# Patient Record
Sex: Male | Born: 1975 | Race: White | Hispanic: No | Marital: Married | Smoking: Former smoker
Health system: Southern US, Community
[De-identification: ages and names within clinical notes are randomized; demographics above are authoritative.]

## PROBLEM LIST (undated history)

## (undated) DIAGNOSIS — N200 Calculus of kidney: Secondary | ICD-10-CM

## (undated) DIAGNOSIS — K56609 Unspecified intestinal obstruction, unspecified as to partial versus complete obstruction: Secondary | ICD-10-CM

## (undated) DIAGNOSIS — I10 Essential (primary) hypertension: Secondary | ICD-10-CM

## (undated) DIAGNOSIS — G4730 Sleep apnea, unspecified: Secondary | ICD-10-CM

## (undated) DIAGNOSIS — E785 Hyperlipidemia, unspecified: Secondary | ICD-10-CM

## (undated) HISTORY — PX: WRIST SURGERY: SHX841

## (undated) HISTORY — DX: Hyperlipidemia, unspecified: E78.5

## (undated) HISTORY — PX: OTHER SURGICAL HISTORY: SHX169

---
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
## 1998-02-22 ENCOUNTER — Emergency Department (HOSPITAL_COMMUNITY): Admission: EM | Admit: 1998-02-22 | Discharge: 1998-02-22 | Payer: Self-pay | Admitting: Emergency Medicine

## 1998-02-22 ENCOUNTER — Encounter: Payer: Self-pay | Admitting: Emergency Medicine

## 2001-08-23 ENCOUNTER — Ambulatory Visit (HOSPITAL_BASED_OUTPATIENT_CLINIC_OR_DEPARTMENT_OTHER): Admission: RE | Admit: 2001-08-23 | Discharge: 2001-08-23 | Payer: Self-pay | Admitting: Emergency Medicine

## 2003-05-29 ENCOUNTER — Ambulatory Visit (HOSPITAL_COMMUNITY): Admission: RE | Admit: 2003-05-29 | Discharge: 2003-05-29 | Payer: Self-pay | Admitting: Urology

## 2003-05-29 ENCOUNTER — Encounter (INDEPENDENT_AMBULATORY_CARE_PROVIDER_SITE_OTHER): Payer: Self-pay | Admitting: Specialist

## 2003-05-29 ENCOUNTER — Ambulatory Visit (HOSPITAL_BASED_OUTPATIENT_CLINIC_OR_DEPARTMENT_OTHER): Admission: RE | Admit: 2003-05-29 | Discharge: 2003-05-29 | Payer: Self-pay | Admitting: Urology

## 2003-07-24 ENCOUNTER — Emergency Department (HOSPITAL_COMMUNITY): Admission: EM | Admit: 2003-07-24 | Discharge: 2003-07-24 | Payer: Self-pay | Admitting: Emergency Medicine

## 2005-04-12 ENCOUNTER — Emergency Department (HOSPITAL_COMMUNITY): Admission: EM | Admit: 2005-04-12 | Discharge: 2005-04-12 | Payer: Self-pay | Admitting: Emergency Medicine

## 2006-06-26 ENCOUNTER — Ambulatory Visit (HOSPITAL_BASED_OUTPATIENT_CLINIC_OR_DEPARTMENT_OTHER): Admission: RE | Admit: 2006-06-26 | Discharge: 2006-06-26 | Payer: Self-pay | Admitting: Emergency Medicine

## 2006-07-01 ENCOUNTER — Ambulatory Visit: Payer: Self-pay | Admitting: Internal Medicine

## 2006-08-15 ENCOUNTER — Ambulatory Visit: Payer: Self-pay | Admitting: Pulmonary Disease

## 2006-11-06 ENCOUNTER — Ambulatory Visit: Payer: Self-pay | Admitting: Pulmonary Disease

## 2006-12-05 ENCOUNTER — Emergency Department (HOSPITAL_COMMUNITY): Admission: EM | Admit: 2006-12-05 | Discharge: 2006-12-05 | Payer: Self-pay | Admitting: Emergency Medicine

## 2007-09-05 ENCOUNTER — Emergency Department (HOSPITAL_COMMUNITY): Admission: EM | Admit: 2007-09-05 | Discharge: 2007-09-05 | Payer: Self-pay | Admitting: Emergency Medicine

## 2007-10-08 ENCOUNTER — Ambulatory Visit: Payer: Self-pay | Admitting: Urology

## 2007-10-15 ENCOUNTER — Emergency Department (HOSPITAL_COMMUNITY): Admission: EM | Admit: 2007-10-15 | Discharge: 2007-10-16 | Payer: Self-pay | Admitting: Emergency Medicine

## 2007-10-23 ENCOUNTER — Ambulatory Visit: Payer: Self-pay | Admitting: Urology

## 2007-10-24 ENCOUNTER — Ambulatory Visit: Payer: Self-pay | Admitting: Urology

## 2007-11-01 ENCOUNTER — Emergency Department (HOSPITAL_COMMUNITY): Admission: EM | Admit: 2007-11-01 | Discharge: 2007-11-02 | Payer: Self-pay | Admitting: Emergency Medicine

## 2007-11-02 ENCOUNTER — Emergency Department (HOSPITAL_COMMUNITY): Admission: EM | Admit: 2007-11-02 | Discharge: 2007-11-02 | Payer: Self-pay | Admitting: Emergency Medicine

## 2007-11-12 ENCOUNTER — Ambulatory Visit: Payer: Self-pay | Admitting: Urology

## 2007-11-25 ENCOUNTER — Encounter: Admission: RE | Admit: 2007-11-25 | Discharge: 2007-11-25 | Payer: Self-pay | Admitting: Emergency Medicine

## 2008-10-30 ENCOUNTER — Encounter: Admission: RE | Admit: 2008-10-30 | Discharge: 2008-10-30 | Payer: Self-pay | Admitting: Internal Medicine

## 2009-01-04 ENCOUNTER — Ambulatory Visit (HOSPITAL_BASED_OUTPATIENT_CLINIC_OR_DEPARTMENT_OTHER): Admission: RE | Admit: 2009-01-04 | Discharge: 2009-01-04 | Payer: Self-pay | Admitting: Urology

## 2009-01-23 IMAGING — CR DG ABDOMEN 1V
1 series · 2 of 2 positions shown · non-contrast
Comparison: none

REASON FOR EXAM: Nephrolithiasis
COMMENTS:

[Series 1: view not recorded · 0.17mm/px · 2 of 2 slices shown]
[im 1/2]
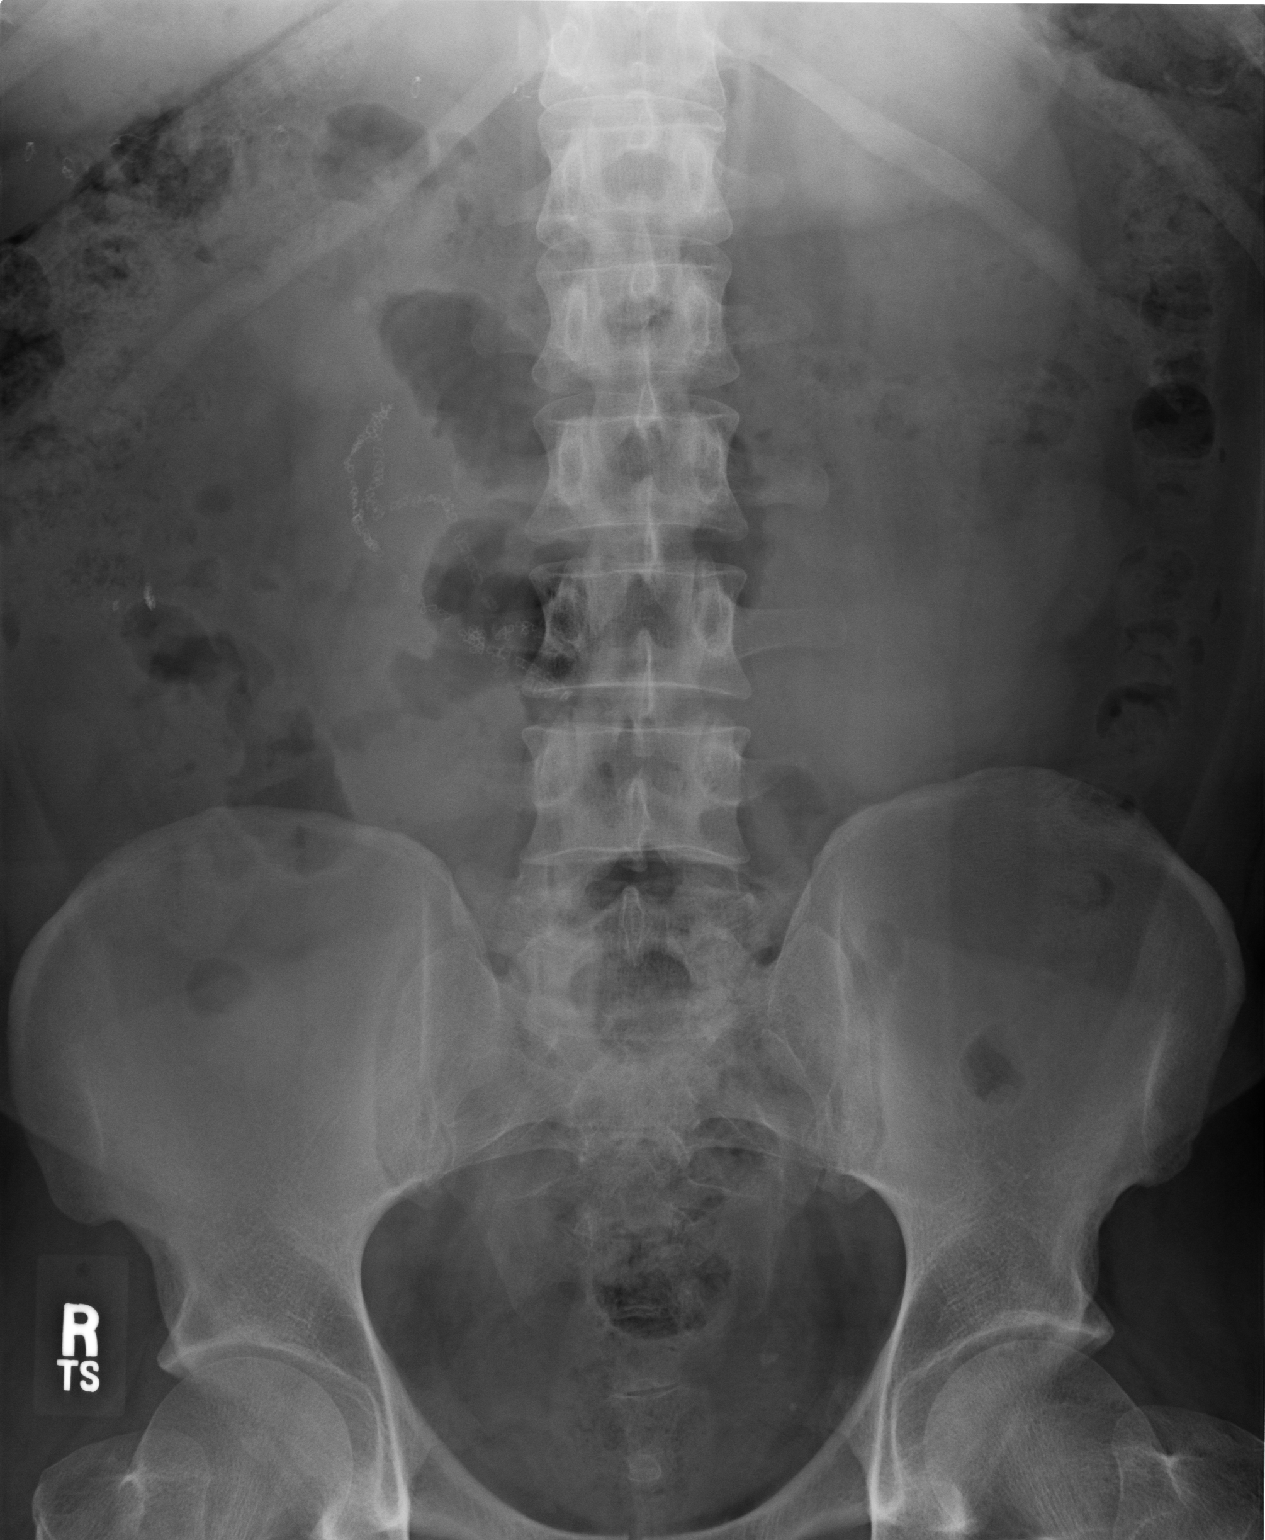
[im 2/2]
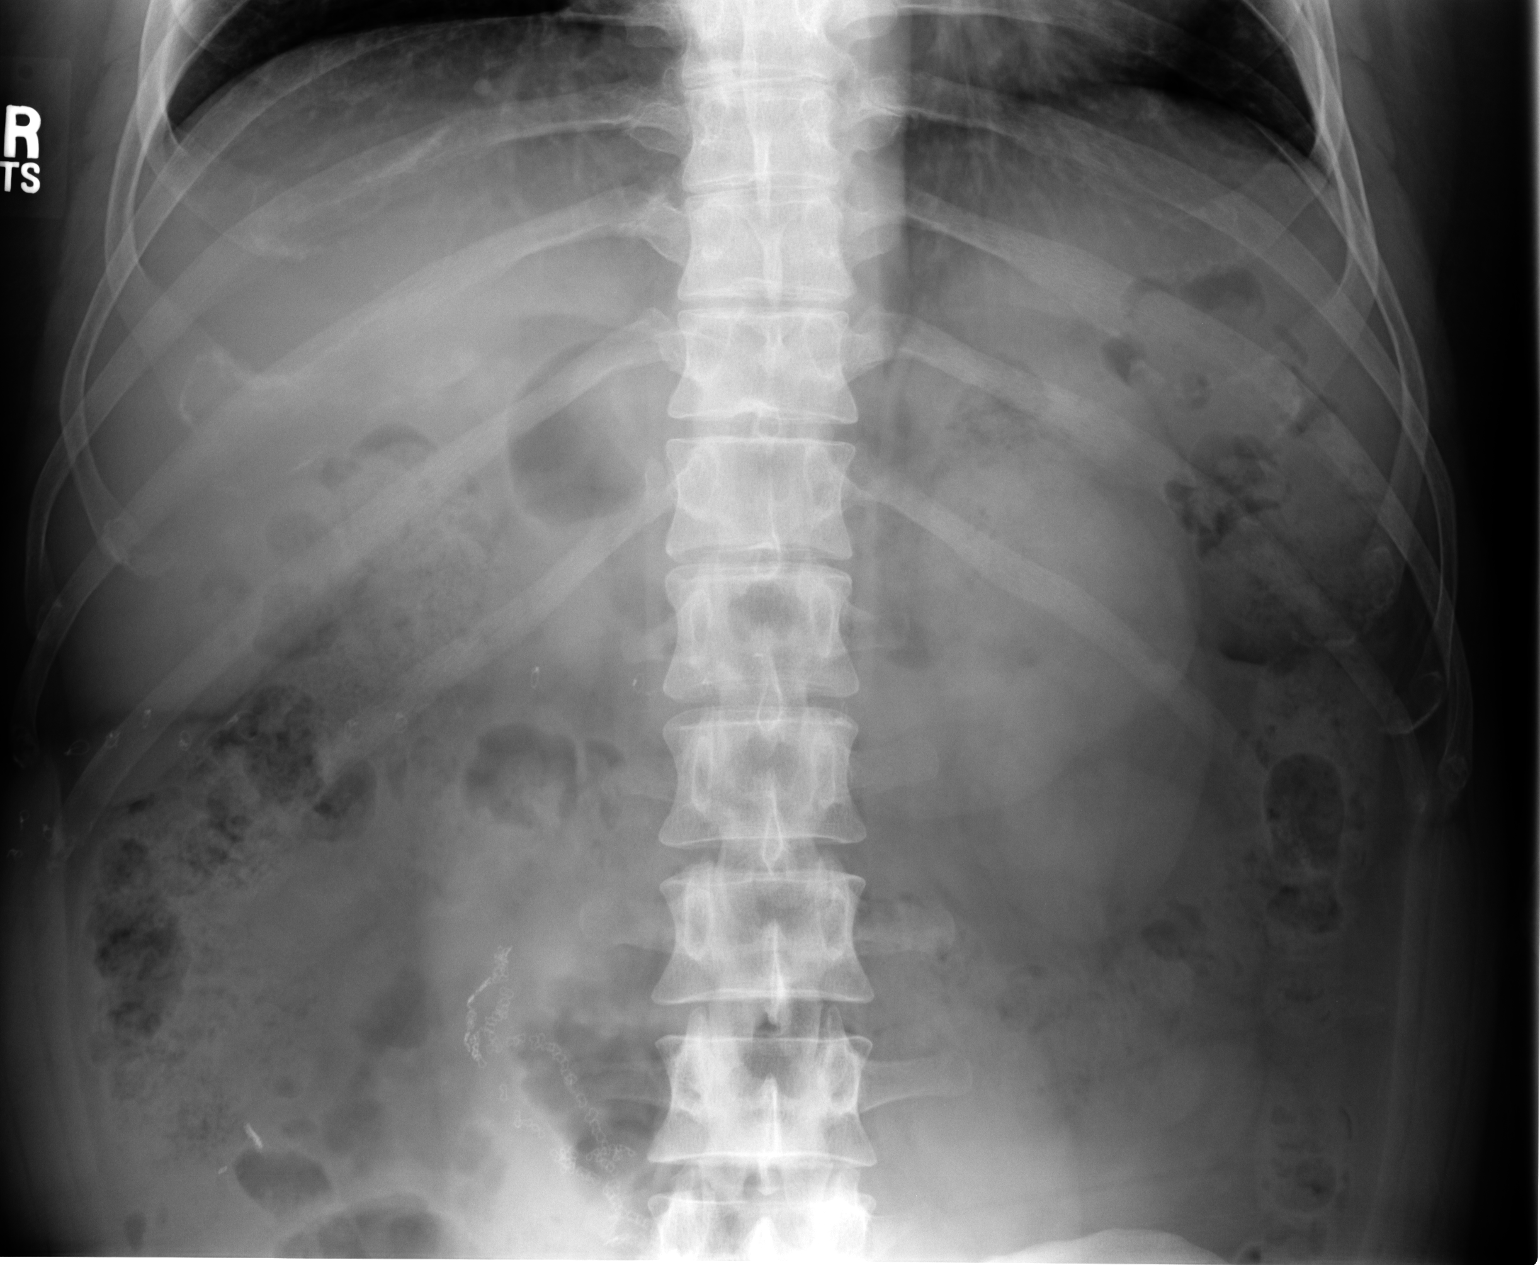

[2 of 2 positions shown; findings below may reference images not displayed]

PROCEDURE:     DXR - DXR KIDNEY URETER BLADDER  - October 08, 2007 [DATE]

RESULT:     There are no prior examinations available for comparison.

The current exam shows a triangular 5.5 mm density in the LEFT pelvis
projected near the level of the LEFT ureterovesical junction suspicious for
a distal LEFT ureteral stone. In one of the two views obtained, there is a
4.0 mm density projected over the lower pole of the RIGHT kidney. This could
possibly represent a lower pole RIGHT renal stone but since the finding is
not seen on the second view, it more likely is artifact. No other densities
suspicious for renal or ureteral stones are identified. Post operative
metallic clips are noted in the RIGHT abdomen.
IMPRESSION: Please see above.

## 2009-02-16 IMAGING — CT CT PELVIS W/O CM
1 series · 15 of 32 positions shown, 19 images · non-contrast
Comparison: 09/05/2007

CT ABDOMEN

CLINICAL DATA: Right flank pain

CT ABDOMEN AND PELVIS WITHOUT CONTRAST
TECHNIQUE: Multidetector CT imaging of the abdomen and pelvis was
performed followig the standard protocol without intravenous
contrast.

[Series 2: 220 stone 5.0 b40f st · axial · 0.74mm/px · z∈[-516,-132]mm · 15 of 86 slices shown, 19 images]
[im 6/86  soft-tissue]
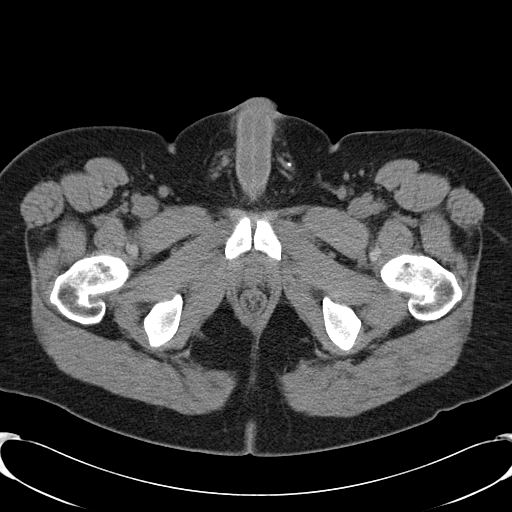
[im 6/86  bone]
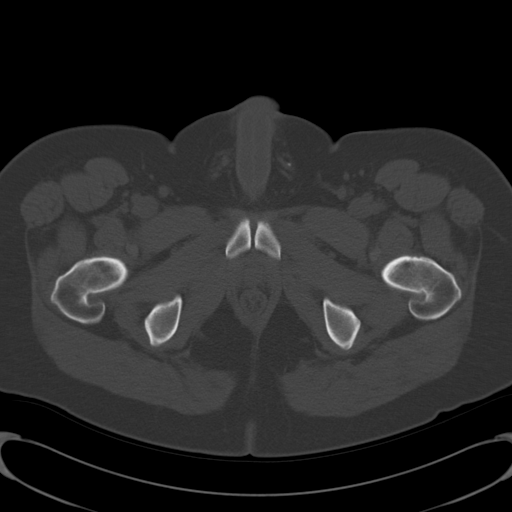
[im 11/86  soft-tissue]
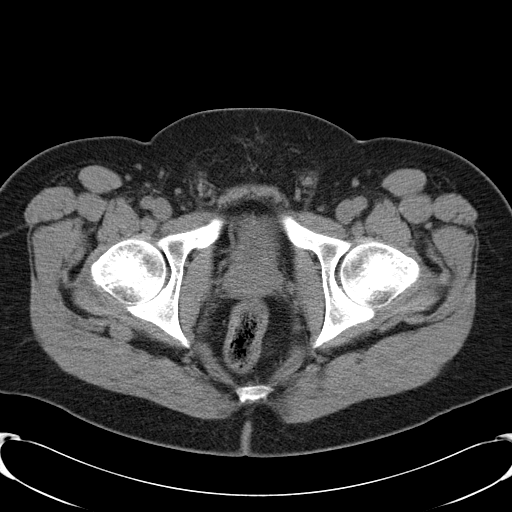
[im 17/86  soft-tissue]
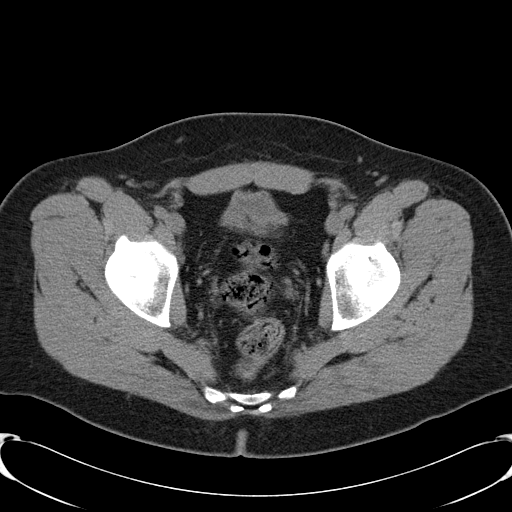
[im 25/86  soft-tissue]
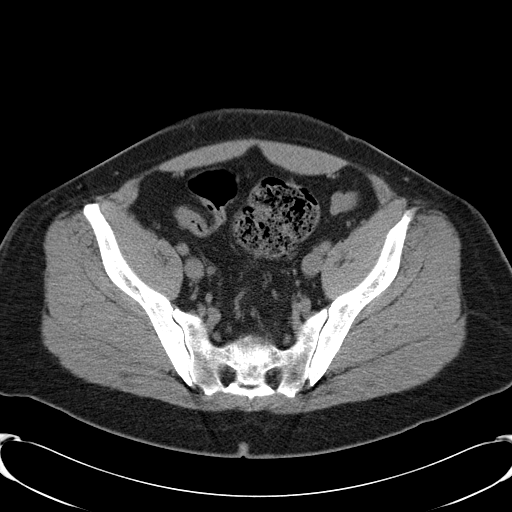
[im 31/86  soft-tissue]
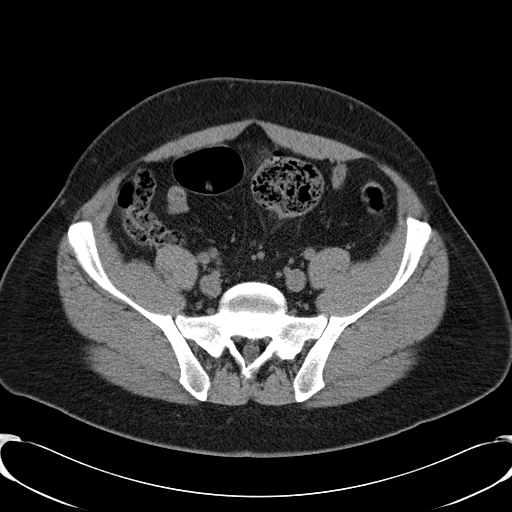
[im 36/86  soft-tissue]
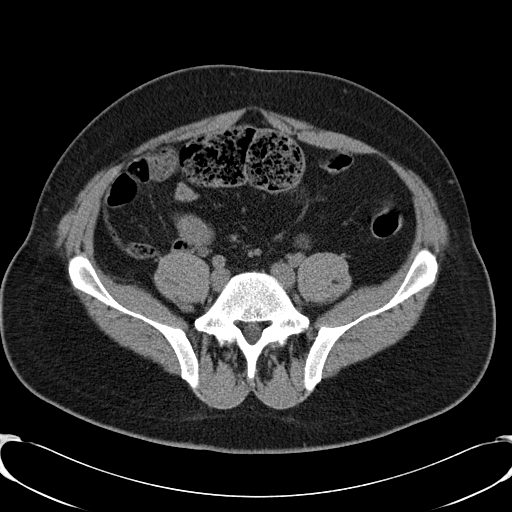
[im 44/86  soft-tissue]
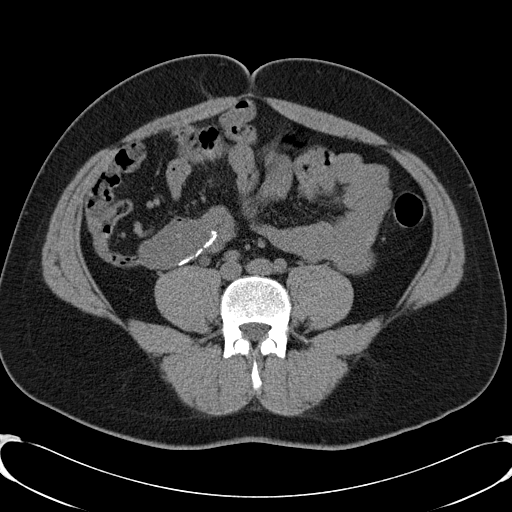
[im 50/86  soft-tissue]
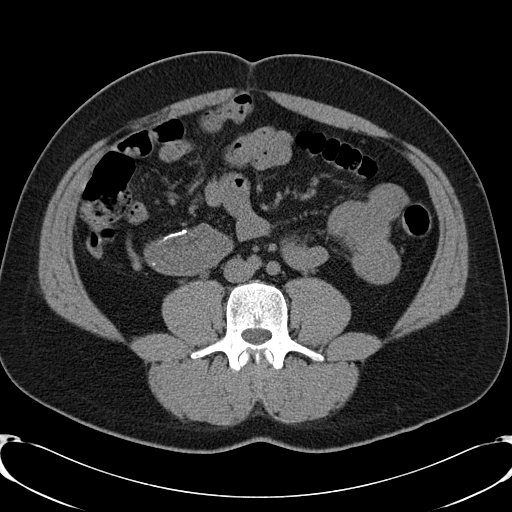
[im 55/86  soft-tissue]
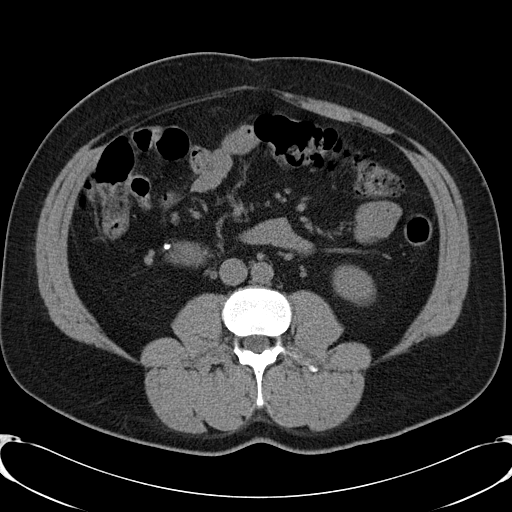
[im 55/86  bone]
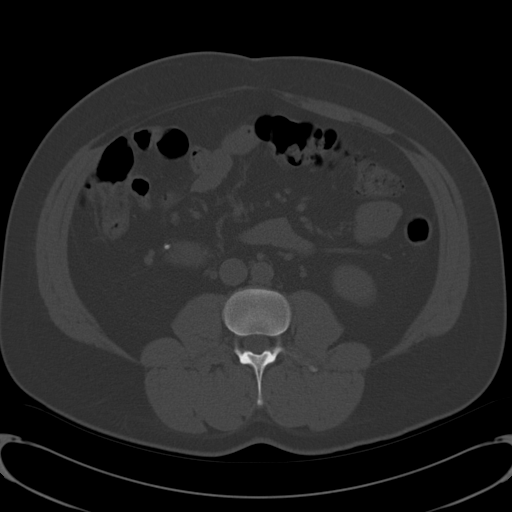
[im 61/86  soft-tissue]
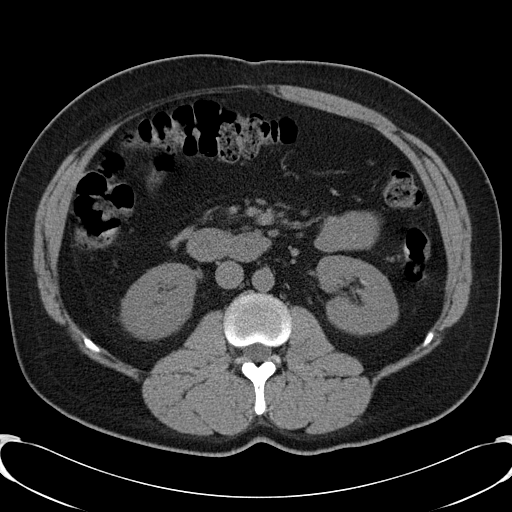
[im 69/86  soft-tissue]
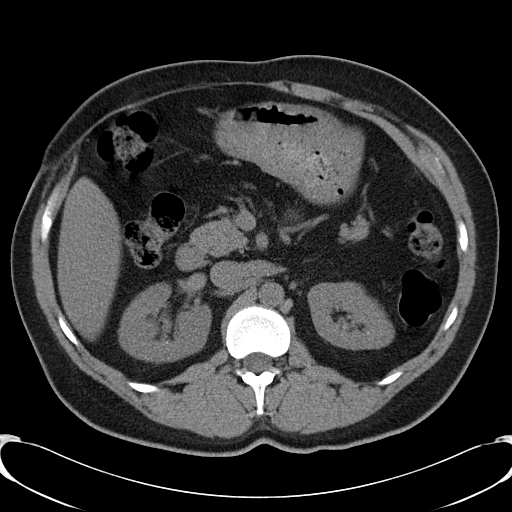
[im 75/86  soft-tissue]
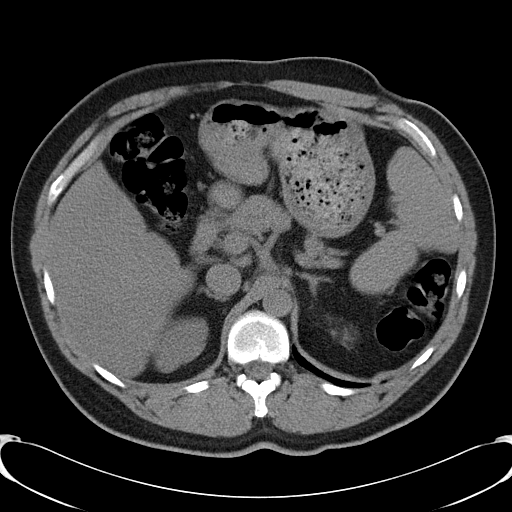
[im 75/86  lung]
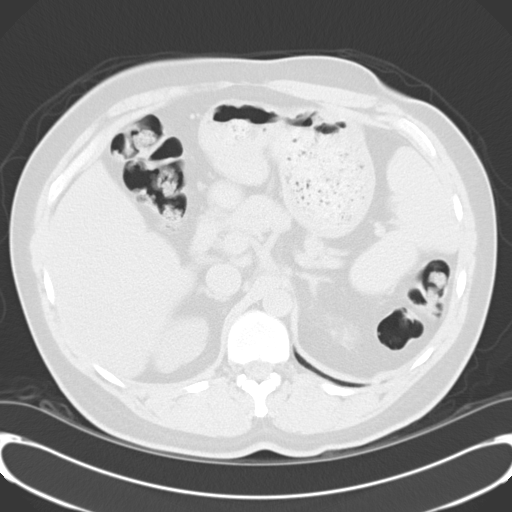
[im 77/86  lung]
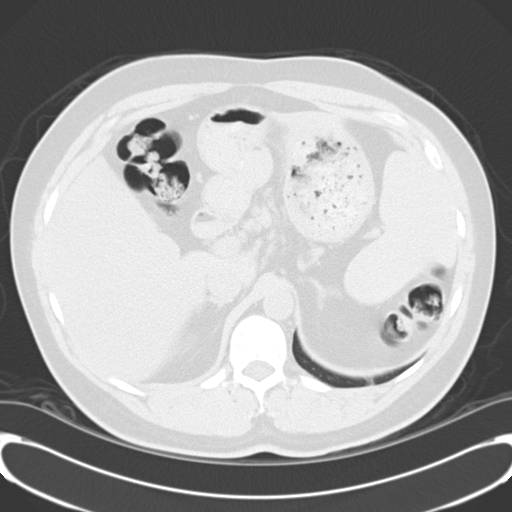
[im 80/86  soft-tissue]
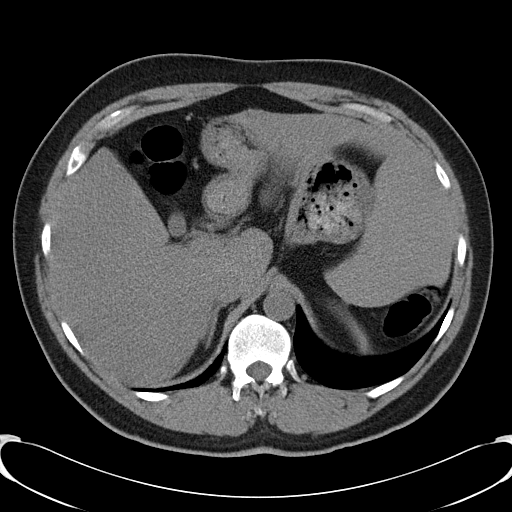
[im 80/86  lung]
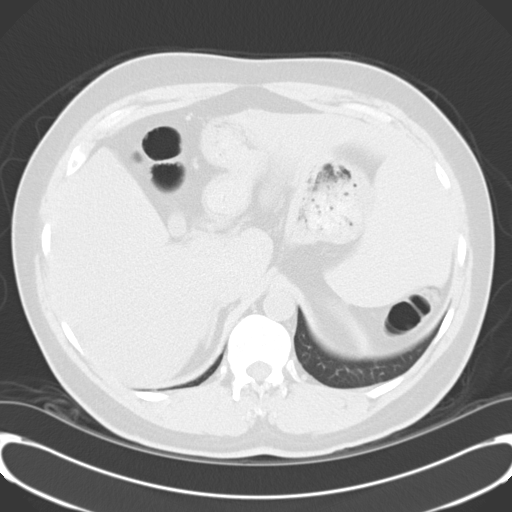
[im 83/86  lung]
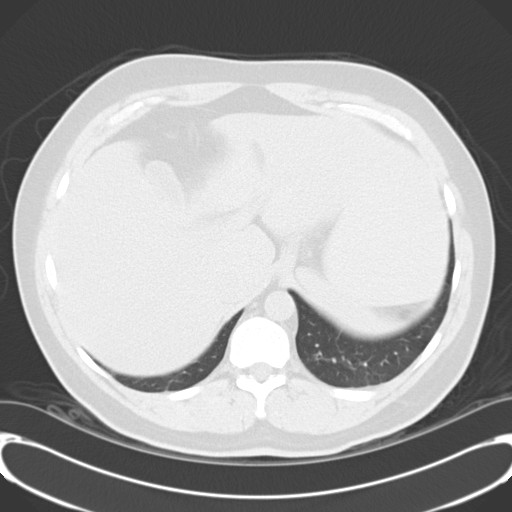

[15 of 32 positions shown; findings below may reference images not displayed]

FINDINGS: The lung bases are clear.  The unenhanced appearance of
the liver and spleen are unremarkable.  The pancreas, adrenal
glands and left kidney are unremarkable except for two tiny left
renal calculi.  There is mild hydronephrosis of the right kidney
and several small renal calculi.  No perinephric interstitial
changes or fluid.

There are surgical changes involve the abdominal wall and the
bowel.  No abdominal mass lesions or adenopathy.  The aorta is
normal in caliber.
IMPRESSION: 1.  Mild to moderate obstructive findings involving the right
kidney.
2.  Bilateral renal calculi.

CT PELVIS
FINDINGS: There is a 5 mm right distal ureteral calculus causing
the mild to moderate right-sided hydroureteronephrosis.  This is
located at the level of the acetabulum.  The rectum, sigmoid colon
visualized small bowel loops unremarkable.  No pelvic masses or
adenopathy.  No significant bony findings.  A small urachal remnant
is noted.
IMPRESSION: 1.  5 mm distal right ureteral calculus causing mild to moderate
grade obstruction.

## 2010-04-26 LAB — POCT I-STAT 4, (NA,K, GLUC, HGB,HCT)
Glucose, Bld: 109 mg/dL — ABNORMAL HIGH (ref 70–99)
HCT: 45 % (ref 39.0–52.0)
Hemoglobin: 15.3 g/dL (ref 13.0–17.0)
Potassium: 4.2 mEq/L (ref 3.5–5.1)
Sodium: 139 mEq/L (ref 135–145)

## 2010-04-26 LAB — GLUCOSE, CAPILLARY: Glucose-Capillary: 105 mg/dL — ABNORMAL HIGH (ref 70–99)

## 2010-06-07 NOTE — Procedures (Signed)
NAME:  Patrick Scott, WIEN NO.:  [id number]   MEDICAL RECORD NO.:  [id number]          PATIENT TYPE:  OUT   LOCATION:  SLEEP CENTER                 FACILITY:  [facility]   PHYSICIAN:  Clinton D. Maple Hudson, MD, FCCP, FACPDATE OF BIRTH:  06-23-1974   DATE OF STUDY:  06/26/2006                            NOCTURNAL POLYSOMNOGRAM   REFERRING PHYSICIAN:   INDICATION FOR STUDY:  Hypersomnia with sleep apnea.   EPWORTH SLEEPINESS SCORE:  8/24, BMI 29.3, weight 205 pounds.   HOME MEDICATIONS:  Listed and reviewed.   SLEEP ARCHITECTURE:  Total sleep time 328 minutes with sleep efficiency  89%.  Stage 1 was 4%, stage 2 80%, stages 3 and 4 3%, REM 13% of total  sleep time.  Sleep latency 14 minutes, REM latency 228 minutes, awake  after sleep onset 28 minutes, arousal index 18.8.  No bedtime medication  was taken.   RESPIRATORY DATA:  Split study protocol.  Apnea/hypopnea index (AHI,  RDI) 28.6 obstructive events per hour indicating moderate obstructive  sleep apnea/hypopnea syndrome before CPAP.  There were 33 obstructive  apneas and 30 hypopneas before CPAP.  The events were more common while  supine, but also significant in lateral sleep positions.  REM AHI 1.4.  CPAP was titrated to 15 CWP, AHI 0 per hour.  A large Respironics  Comfort Gel full face mask was chosen with heated humidifier.   OXYGEN DATA:  Moderate to loud snoring with oxygen desaturation to a  nadir of 86%.  After CPAP control, saturation held 94 to 95% on room  air.   CARDIAC DATA:  Normal sinus rhythm with occasional PVC.   MOVEMENT/PARASOMNIA:  Occasional limb jerk with arousal, insignificant.  No bathroom trips.   IMPRESSION/RECOMMENDATION:  1. Moderate obstructive sleep apnea/hypopnea syndrome, (AHI)      apnea/hypopnea index 28.6 per hour.  Events somewhat more common      while supine.  Moderate to loud snoring with oxygen desaturation to      a nadir of 86%.  2. Successful (CPAP) continuous positive  airway pressure titration to      15 (CWP) centimeters of water pressure,      (AHI) apnea/hypopnea index 0 per hour.  A large Respironics Comfort      Gel full face mask was chosen with heated humidifier.      Clinton D. Maple Hudson, MD, [facility], FACP  Diplomate, Biomedical engineer of Sleep Medicine  Electronically Signed     CDY/MEDQ  D:  06/30/2006 13:00:28  T:  06/30/2006 21:08:15  Job:  [id number]

## 2010-06-07 NOTE — Assessment & Plan Note (Signed)
[facility]                             PULMONARY OFFICE NOTE   NAME:BERRIERAshkan, Patrick Scott                       MRN:          [id number]  DATE:08/15/2006                            DOB:          [birth year]    HISTORY OF PRESENT ILLNESS:  The patient is a 35 year old male who has  been referred for treatment of obstructive sleep apnea. The patient was  first diagnosed with sleep apnea in August 2003 where he was found to  have 13 events per hour. I discussed the various options with him  including CPAP, oral appliance, as well as consideration for upper  airway surgery. The patient decided on the latter and was referred to  Dr. Annalee Genta for evaluation. He agreed the surgery would be indicated  if the patient wanted to go in that direction. However, the patient did  not go through surgery at that time. He now feels like that his symptoms  are worse than they were in the past. He continues to have snoring and  pauses in his breathing during sleep. He typically goes to bed between 9  and 10, and he gets up at 5:30am to start his day. He is not rested upon  arising. The patient works as a IT consultant states that he  is way too busy to have any inappropriate daytime sleepiness. However,  in the evenings, the patient will fall asleep with TV or movies. He has  some sleep pressure with driving. Of note, his weight is up about 5 to  10 pounds over the past 2 years.   PAST MEDICAL HISTORY:  1. Significant for hypertension.  2. History of sleep apnea as stated above.  3. History of colon surgery as an infant.  4. Kidney stones.   CURRENT MEDICATIONS:  1. Altace 5 mg 1 daily.  2. Effexor XR 75 mg daily.   The patient is intolerant to [facility] which causes hallucinations.   SOCIAL HISTORY:  He is single and does not have children. He smokes a  1/2 pack of cigarettes per day and has done so for the last seven years.   FAMILY HISTORY:  Remarkable  for his father dying in 53 of lymphoma.   REVIEW OF SYSTEMS:  As per history of present illness. Also, see the  patient intake form documented on the chart.   PHYSICAL EXAMINATION:  GENERAL:  He is an overweight male in no acute  distress.  VITAL SIGNS:  Blood pressure 122/86, pulse 75, temperature 98.3, weight  213 pounds, O2 saturation on room air is 96%.  HEENT:  Pupils equal, round, and reactive to light and accommodation,  extraocular muscles are intact. Nares show deviated septum to the left.  Oropharynx does show large tonsils with elongation of the soft palette  and uvula.  NECK:  Supple without JVD or lymphadenopathy. There is no palpable  thyromegaly.  CHEST:  Totally clear.  CARDIAC:  Regular rate and rhythm with no murmurs, rubs, or gallops.  ABDOMEN:  Soft and nontender with good bowel sounds.  GENITAL:  Not done and not indicated.  RECTAL:  Not done and not indicated.  BREASTS:  Not done and not indicated.  LOWER EXTREMITIES:  Without edema, pulses are intact distally.  NEUROLOGIC:  Alert and oriented with no obvious motor deficits.   IMPRESSION:  Moderate obstructive sleep apnea from a recent sleep study  with a respiratory disturbance index of 28 events per hour and O2  saturation as low as 86%. The patient was then placed on CPAP and  titrated to a final pressure of 15 with excellent control. The patient  is willing to give this a try because of his increasing symptomatology  from the last visit.   PLAN:  1. Initiate CPAP at 10 sonometers and increase to 13 sonometers in two      weeks if the patient is doing well with it.  2. Work on weight loss.  3. He will follow up in four weeks where we will ultimately get him to      his final pressure of 15 centimeters of water pressure.     Barbaraann Share, MD,FCCP  Electronically Signed    [facility]/MedQ  DD: 09/12/2006  DT: 09/13/2006  Job #: [id number]   cc:   Patrick Scott, M.D.

## 2010-06-10 NOTE — Op Note (Signed)
NAME:  Patrick Scott, Patrick Scott NO.:  [id number]   MEDICAL RECORD NO.:  [id number]                   PATIENT TYPE:  AMB   LOCATION:  [facility]                                 FACILITY:  [facility]   PHYSICIAN:  Boston Service, M.D.             DATE OF BIRTH:  03-Jun-1975   DATE OF PROCEDURE:  05/29/2003  DATE OF DISCHARGE:                                 OPERATIVE REPORT   PREOPERATIVE DIAGNOSES:  A 35 year old male originally seen in our office  2003 medullary sponge kidney. CT scan Jun 05, 2001 scattered 1 and 2 mm  calculi right and left kidneys without evidence of ureteral obstruction.  The patient is status post phallus repair as a newborn, laparotomy for  adhesions as an adult, scattered GI staples and stitches throughout the  abdomen, 12.4 cm right kidney, 13.0 cm left kidney.  Recently patient noted  to have unusual amount of redundant foreskin along the ventral aspect of the  penile shaft.  I had a lengthy discussion with the patient about the various  options for treatment and followup.  The patient was scheduled for redo  circumcision on two separate occasions, he has rescheduled the procedure  twice.  He presents to [facility] today May 29, 2003 for redo  circumcision.   POSTOPERATIVE DIAGNOSES:  Same.   PROCEDURE:  Redo circumcision.   ANESTHESIA:  General.   DRAINS:  None.   COMPLICATIONS:  None.   DESCRIPTION OF PROCEDURE:  The patient was prepped and draped in the supine  position after institution of an adequate level of general anesthesia.  Penile block 0.25% lidocaine without epinephrine was instituted around the  penile base.  A circumferential incision was made proximal to the subcarinal  sulcus taking care to remove the unusual amount of redundant foreskin long  the ventral aspect of the penile shaft. A similar incision was made proximal  to the original incision and a ring of redundant preputial skin was removed  in a  parallel lines technique.  The subcutaneous tissue was then  reapproximated using interrupted sutures of 3-0 Vicryl, skin was then  reapproximated using interrupted sutures of 3-0 chromic.  The wound was  covered with bacitracin ointment, dry gauze and Coban tape.  The patient was  returned to recovery in satisfactory condition.                                               Boston Service, M.D.    RH/MEDQ  D:  05/29/2003  T:  05/29/2003  Job:  [id number]   cc:   [facility] [address]

## 2010-10-21 LAB — URINE MICROSCOPIC-ADD ON

## 2010-10-21 LAB — URINALYSIS, ROUTINE W REFLEX MICROSCOPIC
Protein, ur: NEGATIVE
pH: 5.5

## 2010-10-24 LAB — URINALYSIS, ROUTINE W REFLEX MICROSCOPIC
Glucose, UA: 100 — AB
Hgb urine dipstick: NEGATIVE
Ketones, ur: NEGATIVE
Leukocytes, UA: NEGATIVE
Leukocytes, UA: NEGATIVE
Nitrite: NEGATIVE
Protein, ur: NEGATIVE
Protein, ur: NEGATIVE
Specific Gravity, Urine: 1.02
Urobilinogen, UA: 0.2
Urobilinogen, UA: 0.2
Urobilinogen, UA: 0.2
pH: 6
pH: 5.5
pH: 6

## 2010-10-24 LAB — POCT I-STAT, CHEM 8
BUN: 20
Calcium, Ion: 1.18
Chloride: 103
Chloride: 104
Creatinine, Ser: 1.1
Creatinine, Ser: 1.3
Glucose, Bld: 144 — ABNORMAL HIGH
Glucose, Bld: 233 — ABNORMAL HIGH
HCT: 36 — ABNORMAL LOW
Hemoglobin: 13.6
Hemoglobin: 12.2 — ABNORMAL LOW
Potassium: 4.1
Sodium: 138
TCO2: 26
TCO2: 25

## 2010-10-24 LAB — COMPREHENSIVE METABOLIC PANEL
Albumin: 3.8
BUN: 18
Creatinine, Ser: 1.04
GFR calc non Af Amer: >60
Sodium: 133 — ABNORMAL LOW
Total Protein: 6.4

## 2010-10-24 LAB — DIFFERENTIAL
Eosinophils Absolute: 0.1
Eosinophils Relative: 1
Lymphocytes Relative: 43
Lymphs Abs: 3
Monocytes Absolute: 0.4
Neutrophils Relative %: 49

## 2010-10-24 LAB — URINE MICROSCOPIC-ADD ON

## 2010-10-24 LAB — CBC
MCHC: 34.2
Platelets: 173
RBC: 4.45

## 2010-11-01 LAB — BASIC METABOLIC PANEL
BUN: 12
CO2: 26
Glucose, Bld: 97

## 2010-11-01 LAB — D-DIMER, QUANTITATIVE: D-Dimer, Quant: <0.22

## 2010-11-01 LAB — CBC
MCV: 84.8
Platelets: 221

## 2010-11-01 LAB — DIFFERENTIAL
Basophils Absolute: 0
Basophils Relative: 1
Eosinophils Absolute: 0.2
Lymphocytes Relative: 40
Neutrophils Relative %: 51

## 2010-11-01 LAB — POCT CARDIAC MARKERS
CKMB, poc: 1.1
CKMB, poc: <1 — ABNORMAL LOW
Myoglobin, poc: 84.1
Myoglobin, poc: 93
Operator id: 1415

## 2010-11-01 LAB — PROTIME-INR: Prothrombin Time: 12.8

## 2011-01-16 ENCOUNTER — Encounter: Payer: Self-pay | Admitting: *Deleted

## 2011-01-16 ENCOUNTER — Emergency Department (HOSPITAL_COMMUNITY)
Admission: EM | Admit: 2011-01-16 | Discharge: 2011-01-16 | Disposition: A | Discharge status: Home / Self Care | Payer: 59 | Attending: Emergency Medicine | Admitting: Emergency Medicine

## 2011-01-16 ENCOUNTER — Emergency Department (HOSPITAL_COMMUNITY): Payer: 59

## 2011-01-16 DIAGNOSIS — Z7982 Long term (current) use of aspirin: Secondary | ICD-10-CM | POA: Insufficient documentation

## 2011-01-16 DIAGNOSIS — R109 Unspecified abdominal pain: Secondary | ICD-10-CM | POA: Insufficient documentation

## 2011-01-16 DIAGNOSIS — N23 Unspecified renal colic: Secondary | ICD-10-CM

## 2011-01-16 DIAGNOSIS — N201 Calculus of ureter: Secondary | ICD-10-CM | POA: Insufficient documentation

## 2011-01-16 DIAGNOSIS — I10 Essential (primary) hypertension: Secondary | ICD-10-CM | POA: Insufficient documentation

## 2011-01-16 DIAGNOSIS — E119 Type 2 diabetes mellitus without complications: Secondary | ICD-10-CM | POA: Insufficient documentation

## 2011-01-16 DIAGNOSIS — Z79899 Other long term (current) drug therapy: Secondary | ICD-10-CM | POA: Insufficient documentation

## 2011-01-16 DIAGNOSIS — N200 Calculus of kidney: Secondary | ICD-10-CM | POA: Insufficient documentation

## 2011-01-16 HISTORY — DX: Unspecified intestinal obstruction, unspecified as to partial versus complete obstruction: K56.609

## 2011-01-16 HISTORY — DX: Calculus of kidney: N20.0

## 2011-01-16 HISTORY — DX: Essential (primary) hypertension: I10

## 2011-01-16 LAB — URINALYSIS, ROUTINE W REFLEX MICROSCOPIC
Glucose, UA: NEGATIVE mg/dL
Hgb urine dipstick: NEGATIVE
Ketones, ur: NEGATIVE mg/dL
Protein, ur: NEGATIVE mg/dL
Urobilinogen, UA: 0.2 mg/dL (ref 0.0–1.0)

## 2011-01-16 MED ORDER — TAMSULOSIN HCL 0.4 MG PO CAPS: 0.4000 mg | ORAL_CAPSULE | Freq: Every day | ORAL | Status: DC

## 2011-01-16 MED ORDER — OXYCODONE-ACETAMINOPHEN 5-325 MG PO TABS: 2.0000 | ORAL_TABLET | ORAL | Status: AC | PRN

## 2011-01-16 MED ORDER — KETOROLAC TROMETHAMINE 30 MG/ML IJ SOLN
60.0000 mg | Freq: Once | INTRAMUSCULAR | Status: AC
  Administered 2011-01-16: 60 mg via INTRAMUSCULAR
  Filled 2011-01-16: qty 1

## 2011-01-16 MED ORDER — HYDROMORPHONE HCL PF 1 MG/ML IJ SOLN
1.0000 mg | Freq: Once | INTRAMUSCULAR | Status: AC
  Administered 2011-01-16: 1 mg via INTRAVENOUS
  Filled 2011-01-16: qty 1

## 2011-01-16 NOTE — ED Notes (Signed)
Pt given d/c instructions and 2 prescriptions and walked to d/c window.

## 2011-01-16 NOTE — ED Provider Notes (Signed)
History     CSN: [id number]  Arrival date & time 01/16/11  7829   First MD Initiated Contact with Patient 01/16/11 [phone]      Chief Complaint  Patient presents with  . Flank Pain    (Consider location/radiation/quality/duration/timing/severity/associated sxs/prior treatment) Patient is a 35 y.o. male presenting with flank pain. The history is provided by the patient.  Flank Pain   patient complains .  of 4 days of left-sided flank pain similar to his renal colic. Has been taking Vicodin at home with minimal relief. Denies any dysuria, fever, vomiting, nausea. History of kidney stones and sees Dr. Harlin Rain. Patient denies any testicular pain at this time. Pain is described as sharp and does radiate into his left lower abdomen. No change in bowel habits  Past Medical History  Diagnosis Date  . Kidney stone   . Hypertension   . Diabetes mellitus   . Small bowel obstruction     Past Surgical History  Procedure Date  . Kidney stone removal   . Wrist surgery     right  . Colon resection     No family history on file.  History  Substance Use Topics  . Smoking status: Current Everyday Smoker -- 1.0 packs/day  . Smokeless tobacco: Not on file  . Alcohol Use: Yes     socially      Review of Systems  Genitourinary: Positive for flank pain.  All other systems reviewed and are negative.    Allergies  Mepergan  Home Medications   Current Outpatient Rx  Name Route Sig Dispense Refill  . ASPIRIN 81 MG PO TABS Oral Take 81 mg by mouth daily.      Marland Kitchen LISINOPRIL 10 MG PO TABS Oral Take 10 mg by mouth daily.      Marland Kitchen METFORMIN HCL 1000 MG PO TABS Oral Take 1,000 mg by mouth 2 (two) times daily with a meal.      . PRAVASTATIN SODIUM 20 MG PO TABS Oral Take 20 mg by mouth daily.      Marland Kitchen LORAZEPAM 0.5 MG PO TABS Oral Take 0.5 mg by mouth every 8 (eight) hours.        BP 115/73  Pulse 74  Temp(Src) 97.9 F (36.6 C) (Oral)  Resp 20  Wt 205 lb (92.987 kg)  SpO2  96%  Physical Exam  Nursing note and vitals reviewed. Constitutional: He is oriented to person, place, and time. Vital signs are normal. He appears well-developed and well-nourished.  Non-toxic appearance. No distress.  HENT:  Head: Normocephalic and atraumatic.  Eyes: Conjunctivae, EOM and lids are normal. Pupils are equal, round, and reactive to light.  Neck: Normal range of motion. Neck supple. No tracheal deviation present. No mass present.  Cardiovascular: Normal rate, regular rhythm and normal heart sounds.  Exam reveals no gallop.   No murmur heard. Pulmonary/Chest: Effort normal and breath sounds normal. No stridor. No respiratory distress. He has no decreased breath sounds. He has no wheezes. He has no rhonchi. He has no rales.  Abdominal: Soft. Normal appearance and bowel sounds are normal. He exhibits no distension. There is no tenderness. There is no rebound and no CVA tenderness.  Musculoskeletal: Normal range of motion. He exhibits no edema and no tenderness.  Neurological: He is alert and oriented to person, place, and time. He has normal strength. No cranial nerve deficit or sensory deficit. GCS eye subscore is 4. GCS verbal subscore is 5. GCS motor subscore is 6.  Skin: Skin is warm and dry. No abrasion and no rash noted.  Psychiatric: He has a normal mood and affect. His speech is normal and behavior is normal.    ED Course  Procedures (including critical care time)   Labs Reviewed  URINALYSIS, ROUTINE W REFLEX MICROSCOPIC   No results found.   No diagnosis found.    MDM  Patient given pain medication for renal colic. CT results reviewed no signs of hydroureter. Patient will followup with his urologist        Toy Baker, MD 01/16/11 [phone]

## 2011-01-16 NOTE — ED Notes (Signed)
Pt states "the pain started initially on Wed, I think the stone is stuck @ my bladder, it's tight & I have lots of burning"; pt c/o nausea, denies vomiting

## 2012-04-16 ENCOUNTER — Institutional Professional Consult (permissible substitution): Payer: 59 | Admitting: Pulmonary Disease

## 2012-06-09 ENCOUNTER — Emergency Department (HOSPITAL_COMMUNITY)
Admission: EM | Admit: 2012-06-09 | Discharge: 2012-06-09 | Disposition: A | Discharge status: Home / Self Care | Payer: BC Managed Care – PPO | Attending: Emergency Medicine | Admitting: Emergency Medicine

## 2012-06-09 ENCOUNTER — Encounter (HOSPITAL_COMMUNITY): Payer: Self-pay

## 2012-06-09 DIAGNOSIS — Z7982 Long term (current) use of aspirin: Secondary | ICD-10-CM | POA: Insufficient documentation

## 2012-06-09 DIAGNOSIS — R110 Nausea: Secondary | ICD-10-CM | POA: Insufficient documentation

## 2012-06-09 DIAGNOSIS — F430 Acute stress reaction: Secondary | ICD-10-CM | POA: Insufficient documentation

## 2012-06-09 DIAGNOSIS — E119 Type 2 diabetes mellitus without complications: Secondary | ICD-10-CM | POA: Insufficient documentation

## 2012-06-09 DIAGNOSIS — Z87442 Personal history of urinary calculi: Secondary | ICD-10-CM | POA: Insufficient documentation

## 2012-06-09 DIAGNOSIS — Z8719 Personal history of other diseases of the digestive system: Secondary | ICD-10-CM | POA: Insufficient documentation

## 2012-06-09 DIAGNOSIS — Z79899 Other long term (current) drug therapy: Secondary | ICD-10-CM | POA: Insufficient documentation

## 2012-06-09 DIAGNOSIS — F17200 Nicotine dependence, unspecified, uncomplicated: Secondary | ICD-10-CM | POA: Insufficient documentation

## 2012-06-09 DIAGNOSIS — R1032 Left lower quadrant pain: Secondary | ICD-10-CM | POA: Insufficient documentation

## 2012-06-09 DIAGNOSIS — R51 Headache: Secondary | ICD-10-CM

## 2012-06-09 DIAGNOSIS — I10 Essential (primary) hypertension: Secondary | ICD-10-CM | POA: Insufficient documentation

## 2012-06-09 LAB — URINALYSIS, ROUTINE W REFLEX MICROSCOPIC
Bilirubin Urine: NEGATIVE
Glucose, UA: NEGATIVE mg/dL
Ketones, ur: NEGATIVE mg/dL
Protein, ur: NEGATIVE mg/dL
pH: 5.5 (ref 5.0–8.0)

## 2012-06-09 LAB — BASIC METABOLIC PANEL
BUN: 15 mg/dL (ref 6–23)
CO2: 25 mEq/L (ref 19–32)
Chloride: 97 mEq/L (ref 96–112)
Creatinine, Ser: 0.73 mg/dL (ref 0.50–1.35)
Glucose, Bld: 186 mg/dL — ABNORMAL HIGH (ref 70–99)

## 2012-06-09 MED ORDER — SODIUM CHLORIDE 0.9 % IV BOLUS (SEPSIS)
1000.0000 mL | Freq: Once | INTRAVENOUS | Status: AC
  Administered 2012-06-09: 1000 mL via INTRAVENOUS

## 2012-06-09 MED ORDER — DIPHENHYDRAMINE HCL 25 MG PO CAPS
50.0000 mg | ORAL_CAPSULE | ORAL | Status: AC
  Administered 2012-06-09: 50 mg via ORAL
  Filled 2012-06-09: qty 2

## 2012-06-09 MED ORDER — PROCHLORPERAZINE MALEATE 10 MG PO TABS
10.0000 mg | ORAL_TABLET | Freq: Once | ORAL | Status: AC
  Administered 2012-06-09: 10 mg via ORAL
  Filled 2012-06-09: qty 1

## 2012-06-09 MED ORDER — KETOROLAC TROMETHAMINE 60 MG/2ML IM SOLN
60.0000 mg | Freq: Once | INTRAMUSCULAR | Status: AC
  Administered 2012-06-09: 60 mg via INTRAMUSCULAR
  Filled 2012-06-09: qty 2

## 2012-06-09 NOTE — ED Provider Notes (Signed)
Medical screening examination/treatment/procedure(s) were performed by non-physician practitioner and as supervising physician I was immediately available for consultation/collaboration.  Raeford Razor, MD 06/09/12 [phone]

## 2012-06-09 NOTE — ED Provider Notes (Signed)
History     CSN: [id number]  Arrival date & time 06/09/12  0741   First MD Initiated Contact with Patient 06/09/12 [phone]      Chief Complaint  Patient presents with  . Headache    (Consider location/radiation/quality/duration/timing/severity/associated sxs/prior treatment) HPI Comments: Patient reports occiputal headache that began gradually yesterday.  Initially dull and "tolerable,"  pain is now constant, sharp, 8/10 intensity.  Associated nausea.  Pain radiates into his neck and upper back and is worse with movement.  Has taken aleve yesterday and Excedrin last night without improvement.  States he had headache like this 3 years ago when he has increased stress.  Has increased stress now - family member in town to take over care of his mother who he typically cares for, has upcoming travel.  No recent travel.  Is eating and drinking well.  No trauma.  Denies focal neurological deficits, fever, chills, myalgias, recent URI symptoms, N/V/D.  Has had intermittent left flank pain that he thinks may be a kidney stone.  No urinary symptoms.   Patient is a 37 y.o. male presenting with headaches. The history is provided by the patient and a significant other.  Headache Associated symptoms: no abdominal pain, no congestion, no cough, no diarrhea, no fever, no myalgias, no nausea, no numbness, no sore throat and no vomiting     Past Medical History  Diagnosis Date  . Kidney stone   . Hypertension   . Diabetes mellitus   . Small bowel obstruction     Past Surgical History  Procedure Laterality Date  . Kidney stone removal    . Wrist surgery      right  . Colon resection      History reviewed. No pertinent family history.  History  Substance Use Topics  . Smoking status: Current Every Day Smoker -- 1.00 packs/day  . Smokeless tobacco: Not on file  . Alcohol Use: Yes     Comment: socially      Review of Systems  Constitutional: Negative for fever and chills.  HENT: Negative  for congestion and sore throat.   Eyes: Negative for visual disturbance.  Respiratory: Negative for cough and shortness of breath.   Gastrointestinal: Negative for nausea, vomiting, abdominal pain, diarrhea and blood in stool.  Genitourinary: Positive for flank pain. Negative for dysuria, urgency and frequency.  Musculoskeletal: Negative for myalgias.  Neurological: Positive for headaches. Negative for weakness and numbness.    Allergies  Mepergan  Home Medications   Current Outpatient Rx  Name  Route  Sig  Dispense  Refill  . aspirin 81 MG tablet   Oral   Take 81 mg by mouth daily.           Marland Kitchen aspirin-acetaminophen-caffeine (EXCEDRIN MIGRAINE) 250-250-65 MG per tablet   Oral   Take 2 tablets by mouth every 6 (six) hours as needed for pain.         Marland Kitchen lisinopril (PRINIVIL,ZESTRIL) 10 MG tablet   Oral   Take 10 mg by mouth daily.           Marland Kitchen LORazepam (ATIVAN) 0.5 MG tablet   Oral   Take 0.5 mg by mouth every 8 (eight) hours.           . metFORMIN (GLUCOPHAGE) 1000 MG tablet   Oral   Take 1,000 mg by mouth 2 (two) times daily with a meal.           . naproxen sodium (ANAPROX) 220 MG tablet  Oral   Take 440 mg by mouth as needed (as needed for headache).         . oxyCODONE-acetaminophen (PERCOCET/ROXICET) 5-325 MG per tablet   Oral   Take 1-2 tablets by mouth every 4 (four) hours as needed for pain. 1-2 tablets every 4-6 hours as needed for pain         . PARoxetine (PAXIL) 20 MG tablet   Oral   Take 20 mg by mouth every morning.         . pravastatin (PRAVACHOL) 20 MG tablet   Oral   Take 20 mg by mouth daily.             BP 128/75  Pulse 66  Temp(Src) 98 F (36.7 C) (Oral)  Resp 16  SpO2 98%  Physical Exam  Nursing note and vitals reviewed. Constitutional: He appears well-developed and well-nourished. No distress.  HENT:  Head: Normocephalic and atraumatic.  Neck: Neck supple.  Cardiovascular: Normal rate and regular rhythm.     Pulmonary/Chest: Effort normal and breath sounds normal. No respiratory distress. He has no wheezes. He has no rales.  Abdominal: Soft. He exhibits no distension and no mass. There is tenderness in the left lower quadrant. There is no rebound and no guarding.  Musculoskeletal:       Cervical back: He exhibits no bony tenderness.       Back:  Neurological: He is alert. No cranial nerve deficit. He exhibits normal muscle tone. Coordination normal. GCS eye subscore is 4. GCS verbal subscore is 5. GCS motor subscore is 6.  CN II-XII intact, EOMs intact, no pronator drift, grip strengths equal bilaterally; strength 5/5 in all extremities, sensation intact in all extremities; finger to nose, heel to shin, rapid alternating movements normal; gait is normal.     Skin: He is not diaphoretic.  Psychiatric: He has a normal mood and affect. His behavior is normal. Judgment and thought content normal.    ED Course  Procedures (including critical care time)  Labs Reviewed  BASIC METABOLIC PANEL - Abnormal; Notable for the following:    Sodium 132 (*)    Glucose, Bld 186 (*)    All other components within normal limits  URINALYSIS, ROUTINE W REFLEX MICROSCOPIC   No results found.  11:05 AM Pt reports his headache is much better after medications.  3/10 now, previously 8/10.  Reexamination of abdomen is improved: nondistended, soft, nontender, no guarding, no rebound.   12:12 PM Pain is 2/10 intensity now  1. Headache     MDM  Pt with occipital headache that began gradually over many hours, likely tension headache with tight paraspinal and trapezius muscles and increased stress recently.  Mild dehydration, IVF given.  Pt much improved with benadryl, compazine, and toradol.  Discussed all results with patient.  Pt given return precautions.  Pt verbalizes understanding and agrees with plan.           Trixie Dredge, PA-C 06/09/12 1354

## 2012-06-09 NOTE — ED Notes (Signed)
He c/o h/a since yesterday which is centered at left lower occipital area.  He c/o some nausea (no vomiting) with this also.  He denies fever, nor any other symptom of recent illness.  He recalls having similar headache some three years ago which was not severe enough for him to seek medical treatment.  He denies trauma, and is in no distress. PERRLA at 6mm.

## 2012-11-29 ENCOUNTER — Ambulatory Visit (INDEPENDENT_AMBULATORY_CARE_PROVIDER_SITE_OTHER): Payer: BC Managed Care – PPO | Admitting: Pulmonary Disease

## 2012-11-29 ENCOUNTER — Encounter: Payer: Self-pay | Admitting: Pulmonary Disease

## 2012-11-29 VITALS — BP 102/72 | HR 80 | Temp 98.1°F | Ht 70.0 in | Wt 207.6 lb

## 2012-11-29 DIAGNOSIS — G4733 Obstructive sleep apnea (adult) (pediatric): Secondary | ICD-10-CM | POA: Insufficient documentation

## 2012-11-29 NOTE — Progress Notes (Signed)
Subjective:    Patient ID: Patrick Scott, male    DOB: January 30, 1975, 37 y.o.   MRN: [id number]  HPI The patient is a 37 year old male who comes in today for management of obstructive sleep apnea.  He was diagnosed with moderate OSA in 2008, with an AHI of 29 events per hour.  He was started on CPAP, and has never followed up.  He has been on CPAP since that time, but now is having breakthrough snoring and apnea according to his bed partner.  He is using a full face mask, and has kept up with mask changes and supplies.  His machine is making noises, and he is unsure if this is functioning properly.  He notes nonrestorative sleep, and he has sleepiness during the day with inactivity.  His weight is a few pounds since his last visit, and his Epworth score is 11.   Sleep Questionnaire What time do you typically go to bed?( Between what hours) 10pm 10pm at 1551 on 11/29/12 by Maisie Fus, CMA How long does it take you to fall asleep? less than 5 mins less than 5 mins at 1551 on 11/29/12 by Maisie Fus, CMA How many times during the night do you wake up? 3 3 at 1551 on 11/29/12 by Maisie Fus, CMA What time do you get out of bed to start your day? 0500 0500 at 1551 on 11/29/12 by Maisie Fus, CMA Do you drive or operate heavy machinery in your occupation? No No at 1551 on 11/29/12 by Maisie Fus, CMA How much has your weight changed (up or down) over the past two years? (In pounds) 10 lb (4.536 kg)10 lb (4.536 kg) increase/decrease at 1551 on 11/29/12 by Maisie Fus, CMA Have you ever had a sleep study before? Yes Yes at 1551 on 11/29/12 by Maisie Fus, CMA If yes, [facility] of [facility] at 1551 on 11/29/12 by Maisie Fus, CMA If yes, date of study? 2005 2005 at 1551 on 11/29/12 by Maisie Fus, CMA Do you currently use CPAP? Yes Yes at 1551 on 11/29/12 by Maisie Fus, CMA If so, what pressure? 11 11 at 1551 on 11/29/12 by Maisie Fus, CMA Do you  wear oxygen at any time? No No at 1551 on 11/29/12 by Maisie Fus, CMA   Review of Systems  Constitutional: Negative for fever and unexpected weight change.  HENT: Positive for congestion. Negative for dental problem, ear pain, nosebleeds, postnasal drip, rhinorrhea, sinus pressure, sneezing, sore throat and trouble swallowing.   Eyes: Negative for redness and itching.  Respiratory: Negative for cough, chest tightness, shortness of breath and wheezing.   Cardiovascular: Negative for palpitations and leg swelling.  Gastrointestinal: Negative for nausea and vomiting.  Genitourinary: Negative for dysuria.  Musculoskeletal: Negative for joint swelling.  Skin: Negative for rash.  Neurological: Negative for headaches.  Hematological: Does not bruise/bleed easily.  Psychiatric/Behavioral: Positive for dysphoric mood. The patient is nervous/anxious.        Objective:   Physical Exam Constitutional:  Overweight male, no acute distress  HENT:  Nares patent without discharge  Oropharynx without exudate, palate and uvula are thick and elongated, small space posteriorly  Eyes:  Perrla, eomi, no scleral icterus  Neck:  No JVD, no TMG  Cardiovascular:  Normal rate, regular rhythm, no rubs or gallops.  No murmurs        Intact distal pulses  Pulmonary :  Normal breath sounds, no  stridor or respiratory distress   No rales, rhonchi, or wheezing  Abdominal:  Soft, nondistended, bowel sounds present.  No tenderness noted.   Musculoskeletal:  No lower extremity edema noted.  Lymph Nodes:  No cervical lymphadenopathy noted  Skin:  No cyanosis noted  Neurologic:  Alert, appropriate, moves all 4 extremities without obvious deficit.          Assessment & Plan:

## 2012-11-29 NOTE — Patient Instructions (Signed)
Will get you a new machine, and use the auto mode to re-optimize your pressure.  Will call you once I receive the download. Work on weight loss followup with me in one year if doing well.

## 2012-11-29 NOTE — Assessment & Plan Note (Signed)
The patient has a history of moderate obstructive sleep apnea, and has been compliant with CPAP over the years.  He is now having issues with breakthrough snoring and apnea, nonrestorative sleep, and increased daytime sleepiness.  This does not appear to be a mask issues, and I suspect it is secondary to increased pressure needs and perhaps an older machine that is not operating appropriately.  Will order the patient a new device, and used the automatic setting to optimize his pressure again.  I have also encouraged him to work aggressively on weight loss.

## 2014-04-28 ENCOUNTER — Telehealth: Payer: Self-pay | Admitting: Pulmonary Disease

## 2014-04-28 NOTE — Telephone Encounter (Signed)
Spoke with Jule Ser at [facility], is calling to check the status of a form that was sent to [facility] on this pt.  I had Kayli re-fax these forms to [facility] at main fax #.  Will forward this message to Mindy to follow up on.

## 2014-04-29 NOTE — Telephone Encounter (Signed)
Put on your desk 

## 2014-04-29 NOTE — Telephone Encounter (Signed)
This has been received and placed in [place] green folder. Please advise thanks

## 2014-04-30 NOTE — Telephone Encounter (Signed)
ATC Kaylie  - NA - Will try back later

## 2014-04-30 NOTE — Telephone Encounter (Signed)
Patrick Scott returned call  [phone]

## 2014-04-30 NOTE — Telephone Encounter (Signed)
LMTC x 1 for [facility]

## 2014-04-30 NOTE — Telephone Encounter (Signed)
lmomtcb x1 for [employer]  Per [place]: if pt signs release then can send his office notes. He is not filling out forms as it is too long. thanks

## 2014-04-30 NOTE — Telephone Encounter (Signed)
Amado Coe returned call - [phone]

## 2014-05-01 NOTE — Telephone Encounter (Signed)
ATC Kaylie - NA, wcb Rang several times, no voicemail.

## 2014-05-04 NOTE — Telephone Encounter (Signed)
[name] cb

## 2014-05-04 NOTE — Telephone Encounter (Signed)
Attempted to call [facility]. No answer, no option to leave a message.

## 2014-05-04 NOTE — Telephone Encounter (Signed)
Spoke with Amado Coe at [facility], advised that [facility] will not fill out forms but will send ov notes with a release.  Amado Coe states she will refax this over, verified fax #.

## 2014-05-05 NOTE — Telephone Encounter (Signed)
Form has been received from [employer] asking for it to be filled out and returned to [place] in medical records. Release also received from pt attached to form. [place] has form in green folder.

## 2014-05-11 NOTE — Telephone Encounter (Signed)
Forms have been sent down. Nothing further needed

## 2014-05-11 NOTE — Telephone Encounter (Signed)
Has [place] signed these forms? Thanks.

## 2014-05-11 NOTE — Telephone Encounter (Signed)
done

## 2014-05-11 NOTE — Telephone Encounter (Signed)
[place] has form.

## 2014-06-19 ENCOUNTER — Telehealth: Payer: Self-pay | Admitting: Pulmonary Disease

## 2014-06-19 NOTE — Telephone Encounter (Signed)
Left message for [facility] at [facility] to return call.

## 2014-06-23 NOTE — Telephone Encounter (Signed)
Spoke to [place] at [facility], we did not receive form, she had the wrong fax number.  Gave her the correct fax number, she is faxing form.  To Mindy to follow up.

## 2014-06-24 NOTE — Telephone Encounter (Signed)
See former phone note as this has already been done. thanks

## 2014-06-24 NOTE — Telephone Encounter (Signed)
lmomtcb for [facility] with [employer] to advised forms were sent to [employer] per previous phone msg.

## 2014-06-25 NOTE — Telephone Encounter (Signed)
LMOM TCB x2 for Kayley Have also ATC [employer] to see if the forms have been faxed out but no answer

## 2014-07-02 ENCOUNTER — Telehealth: Payer: Self-pay | Admitting: Pulmonary Disease

## 2014-07-02 NOTE — Telephone Encounter (Signed)
Called kayle and LMTCB x1 See prior message. If this has not been received then these are in epic scanned in "media tab"

## 2014-07-03 NOTE — Telephone Encounter (Signed)
Left message for Nyra Jabs to call back.

## 2014-07-06 ENCOUNTER — Telehealth: Payer: Self-pay | Admitting: Pulmonary Disease

## 2014-07-06 NOTE — Telephone Encounter (Signed)
lmomtcb x1 

## 2014-07-07 NOTE — Telephone Encounter (Signed)
lmomtcb x 2  

## 2014-07-08 NOTE — Telephone Encounter (Signed)
Return [name] °

## 2014-07-08 NOTE — Telephone Encounter (Signed)
lmomtcb x1 

## 2014-07-08 NOTE — Telephone Encounter (Signed)
lmtcb x3 

## 2014-07-09 NOTE — Telephone Encounter (Signed)
lmtcb

## 2014-07-10 NOTE — Telephone Encounter (Signed)
Called [place] from [place], left detailed message on voicemail advising her to call back if she has any further questions. Closing encounter.

## 2014-07-20 ENCOUNTER — Telehealth: Payer: Self-pay | Admitting: Pulmonary Disease

## 2014-07-20 NOTE — Telephone Encounter (Signed)
Called [place], left message on voicemail advising her to contact [employer] regarding forms, gave her the number for [employer]. Nothing further needed.

## 2015-04-20 ENCOUNTER — Emergency Department (HOSPITAL_COMMUNITY)
Admission: EM | Admit: 2015-04-20 | Discharge: 2015-04-20 | Disposition: A | Discharge status: Home / Self Care | Payer: BLUE CROSS/BLUE SHIELD | Attending: Emergency Medicine | Admitting: Emergency Medicine

## 2015-04-20 ENCOUNTER — Encounter (HOSPITAL_COMMUNITY): Payer: Self-pay | Admitting: Emergency Medicine

## 2015-04-20 ENCOUNTER — Emergency Department (HOSPITAL_COMMUNITY): Payer: BLUE CROSS/BLUE SHIELD

## 2015-04-20 DIAGNOSIS — Y998 Other external cause status: Secondary | ICD-10-CM | POA: Diagnosis not present

## 2015-04-20 DIAGNOSIS — Z79899 Other long term (current) drug therapy: Secondary | ICD-10-CM | POA: Diagnosis not present

## 2015-04-20 DIAGNOSIS — Z7984 Long term (current) use of oral hypoglycemic drugs: Secondary | ICD-10-CM | POA: Insufficient documentation

## 2015-04-20 DIAGNOSIS — Z7982 Long term (current) use of aspirin: Secondary | ICD-10-CM | POA: Insufficient documentation

## 2015-04-20 DIAGNOSIS — Z8669 Personal history of other diseases of the nervous system and sense organs: Secondary | ICD-10-CM | POA: Diagnosis not present

## 2015-04-20 DIAGNOSIS — I10 Essential (primary) hypertension: Secondary | ICD-10-CM | POA: Diagnosis not present

## 2015-04-20 DIAGNOSIS — Z8719 Personal history of other diseases of the digestive system: Secondary | ICD-10-CM | POA: Insufficient documentation

## 2015-04-20 DIAGNOSIS — F17210 Nicotine dependence, cigarettes, uncomplicated: Secondary | ICD-10-CM | POA: Diagnosis not present

## 2015-04-20 DIAGNOSIS — S0990XA Unspecified injury of head, initial encounter: Secondary | ICD-10-CM | POA: Insufficient documentation

## 2015-04-20 DIAGNOSIS — Y9389 Activity, other specified: Secondary | ICD-10-CM | POA: Insufficient documentation

## 2015-04-20 DIAGNOSIS — Y9289 Other specified places as the place of occurrence of the external cause: Secondary | ICD-10-CM | POA: Diagnosis not present

## 2015-04-20 DIAGNOSIS — J3489 Other specified disorders of nose and nasal sinuses: Secondary | ICD-10-CM | POA: Insufficient documentation

## 2015-04-20 DIAGNOSIS — E785 Hyperlipidemia, unspecified: Secondary | ICD-10-CM | POA: Diagnosis not present

## 2015-04-20 DIAGNOSIS — E119 Type 2 diabetes mellitus without complications: Secondary | ICD-10-CM | POA: Diagnosis not present

## 2015-04-20 DIAGNOSIS — W2209XA Striking against other stationary object, initial encounter: Secondary | ICD-10-CM | POA: Insufficient documentation

## 2015-04-20 DIAGNOSIS — Z87442 Personal history of urinary calculi: Secondary | ICD-10-CM | POA: Insufficient documentation

## 2015-04-20 HISTORY — DX: Sleep apnea, unspecified: G47.30

## 2015-04-20 NOTE — Discharge Instructions (Signed)
Take over the counter tylenol and ibuprofen, as directed on packaging, as needed for headache. Call your regular medical doctor tomorrow to schedule a follow up appointment within the next 2 days.  Return to the Emergency Department immediately sooner if worsening.

## 2015-04-20 NOTE — ED Notes (Signed)
Pt states around 14:45 this afternoon he hit his head on the car door very hard. States he saw black spots afterwards and had a splitting headache. States he took some ibuprofen and went to sleep. Awoke from his nap with no improvement to headache. Denies being on blood thinners. Alert and oriented x 4, no neuro deficits observed in triage, pupils 4+, equal round reactive to light. Denies any nausea/vomiting.

## 2015-04-20 NOTE — ED Provider Notes (Signed)
CSN: [id number]     Arrival date & time 04/20/15  1730 History   First MD Initiated Contact with Patient 04/20/15 2054     Chief Complaint  Patient presents with  . Head Injury      HPI Pt was seen at 2100.  Per pt, c/o sudden onset and resolution of one episode of head injury that occurred today approximately 1445. Pt states he hit his head on the car door "very hard." States he "saw spots" immediately. States he has had a headache since the injury. Pt is concerned he has "bleeding in my head." Denies LOC, no AMS, no neck or back pain, no focal motor weakness, no tingling/numbness in extremities, no visual changes.    Past Medical History  Diagnosis Date  . Kidney stone   . Hypertension   . Diabetes mellitus   . Small bowel obstruction ([place])   . Hyperlipidemia   . Sleep apnea    Past Surgical History  Procedure Laterality Date  . Kidney stone removal    . Wrist surgery      right  . Colon resection  1988  . Appendectomy  1988   Family History  Problem Relation Age of Onset  . Cancer Father     deceased at age 35   Social History  Substance Use Topics  . Smoking status: Current Every Day Smoker -- 1.00 packs/day for 13 years    Types: Cigarettes  . Smokeless tobacco: None  . Alcohol Use: Yes     Comment: socially    Review of Systems ROS: Statement: All systems negative except as marked or noted in the HPI; Constitutional: Negative for fever and chills. ; ; Eyes: Negative for eye pain, redness and discharge. ; ; ENMT: Negative for ear pain, hoarseness, nasal congestion, sinus pressure and sore throat. ; ; Cardiovascular: Negative for chest pain, palpitations, diaphoresis, dyspnea and peripheral edema. ; ; Respiratory: Negative for cough, wheezing and stridor. ; ; Gastrointestinal: Negative for nausea, vomiting, diarrhea, abdominal pain, blood in stool, hematemesis, jaundice and rectal bleeding. . ; ; Genitourinary: Negative for dysuria, flank pain and hematuria. ; ;  Musculoskeletal: +head injury. Negative for back pain and neck pain. Negative for swelling and deformity.; ; Skin: Negative for pruritus, rash, abrasions, blisters, bruising and skin lesion.; ; Neuro: Negative for headache, lightheadedness and neck stiffness. Negative for weakness, altered level of consciousness , altered mental status, extremity weakness, paresthesias, involuntary movement, seizure and syncope.      Allergies  Mepergan  Home Medications   Prior to Admission medications   Medication Sig Start Date End Date Taking? Authorizing Provider  aspirin 81 MG tablet Take 81 mg by mouth daily.     Yes Historical Provider, MD  cetirizine (ZYRTEC) 10 MG tablet Take 10 mg by mouth daily.   Yes Historical Provider, MD  glimepiride (AMARYL) 1 MG tablet Take 1 mg by mouth daily with breakfast.   Yes Historical Provider, MD  ibuprofen (ADVIL,MOTRIN) 200 MG tablet Take 800 mg by mouth every 6 (six) hours as needed for headache.   Yes Historical Provider, MD  lisinopril (PRINIVIL,ZESTRIL) 10 MG tablet Take 10 mg by mouth daily.     Yes Historical Provider, MD  LORazepam (ATIVAN) 0.5 MG tablet Take 0.5 mg by mouth 2 (two) times daily.    Yes Historical Provider, MD  metFORMIN (GLUCOPHAGE) 1000 MG tablet Take 1,000 mg by mouth 2 (two) times daily with a meal.     Yes Historical Provider,  MD  oxyCODONE-acetaminophen (PERCOCET/ROXICET) 5-325 MG per tablet Take 2 tablets by mouth every 6 (six) hours as needed for moderate pain or severe pain. 1-2 tablets every 4-6 hours as needed for pain   Yes Historical Provider, MD  PARoxetine (PAXIL) 20 MG tablet Take 20 mg by mouth every morning.   Yes Historical Provider, MD  pravastatin (PRAVACHOL) 20 MG tablet Take 20 mg by mouth daily.     Yes Historical Provider, MD  TOUJEO SOLOSTAR 300 UNIT/ML SOPN Inject 56 Units into the vein daily.  03/15/15  Yes Historical Provider, MD   BP 130/81 mmHg  Pulse 65  Temp(Src) 97.6 F (36.4 C) (Oral)  Resp 18  SpO2  97% Physical Exam  2105: Physical examination:  Nursing notes reviewed; Vital signs and O2 SAT reviewed;  Constitutional: Well developed, Well nourished, Well hydrated, In no acute distress; Head:  Normocephalic, atraumatic; Eyes: EOMI, PERRL, No scleral icterus; ENMT: TM's clear bilat. +edemetous nasal turbinates bilat with clear rhinorrhea. Mouth and pharynx normal, Mucous membranes moist; Neck: Supple, Full range of motion, No lymphadenopathy; Cardiovascular: Regular rate and rhythm, No murmur, rub, or gallop; Respiratory: Breath sounds clear & equal bilaterally, No rales, rhonchi, wheezes.  Speaking full sentences with ease, Normal respiratory effort/excursion; Chest: Nontender, Movement normal; Abdomen: Soft, Nontender, Nondistended, Normal bowel sounds; Genitourinary: No CVA tenderness; Spine:  No midline CS, TS, LS tenderness.;; Extremities: Pulses normal, No tenderness, No edema, No calf edema or asymmetry.; Neuro: AA&Ox3, Major CN grossly intact.  Speech clear. No gross focal motor or sensory deficits in extremities. Typing on laptop computer on my arrival to exam room.;; Skin: Color normal, Warm, Dry.   ED Course  Procedures (including critical care time) Labs Review  Imaging Review  I have personally reviewed and evaluated these images and lab results as part of my medical decision-making.   EKG Interpretation None      MDM  MDM Reviewed: previous chart, nursing note and vitals Interpretation: CT scan     Ct Head Wo Contrast 04/20/2015  CLINICAL DATA:  Hit head on car door this afternoon. Splitting headache. EXAM: CT HEAD WITHOUT CONTRAST TECHNIQUE: Contiguous axial images were obtained from the base of the skull through the vertex without intravenous contrast. COMPARISON:  None. FINDINGS: The ventricles are normal in size and configuration. No extra-axial fluid collections are identified. Incidental giant cisterna magna. The gray-white differentiation is normal. No CT findings  for acute intracranial process such as hemorrhage or infarction. No mass lesions. The brainstem and cerebellum are grossly normal. The bony structures are intact. Scattered sinus disease the frontal sinus and ethmoid air cells. The mastoid air cells and middle ear cavities are clear. The globes are intact. IMPRESSION: Normal head CT. Electronically Signed   By: Marijo Sanes M.D.   On: 04/20/2015 19:25    2120:  Pt insistent he "wants a CT scan." CT scan performed and is reassuring. Pt states he is ready to go home now. Dx and testing d/w pt and friend.  Questions answered.  Verb understanding, agreeable to d/c home with outpt f/u.   Francine Graven, DO 04/23/15 [phone]

## 2015-04-27 DIAGNOSIS — E119 Type 2 diabetes mellitus without complications: Secondary | ICD-10-CM | POA: Diagnosis not present

## 2015-05-27 ENCOUNTER — Ambulatory Visit (INDEPENDENT_AMBULATORY_CARE_PROVIDER_SITE_OTHER)
Admission: RE | Admit: 2015-05-27 | Discharge: 2015-05-27 | Disposition: A | Discharge status: Home / Self Care | Payer: BLUE CROSS/BLUE SHIELD | Source: Ambulatory Visit | Attending: Pulmonary Disease | Admitting: Pulmonary Disease

## 2015-05-27 ENCOUNTER — Ambulatory Visit (INDEPENDENT_AMBULATORY_CARE_PROVIDER_SITE_OTHER): Payer: BLUE CROSS/BLUE SHIELD | Admitting: Pulmonary Disease

## 2015-05-27 ENCOUNTER — Encounter: Payer: Self-pay | Admitting: Pulmonary Disease

## 2015-05-27 VITALS — BP 118/72 | HR 87 | Ht 70.0 in | Wt 220.0 lb

## 2015-05-27 DIAGNOSIS — R0600 Dyspnea, unspecified: Secondary | ICD-10-CM | POA: Diagnosis not present

## 2015-05-27 DIAGNOSIS — R0609 Other forms of dyspnea: Secondary | ICD-10-CM

## 2015-05-27 DIAGNOSIS — Z720 Tobacco use: Secondary | ICD-10-CM | POA: Insufficient documentation

## 2015-05-27 DIAGNOSIS — G4733 Obstructive sleep apnea (adult) (pediatric): Secondary | ICD-10-CM | POA: Diagnosis not present

## 2015-05-27 NOTE — Assessment & Plan Note (Signed)
Recent exertional dyspnea. Cont to smoke 1/2 PPD. Not known to have asthma or copd. Has DM.  H/O melanoma in 2004.  Plan : 1. CXR. May need chest ct scan if abn. 2. Hold off on pft. May need pft, abg, echo if more sx. Pt will call.

## 2015-05-27 NOTE — Assessment & Plan Note (Signed)
Pt and husband smoke. Plan to quit smoking in June -- after vacation. May try patches again.

## 2015-05-27 NOTE — Patient Instructions (Signed)
1. Continue using CPAP as discussed. 2. We will order you CPAP supplies. Give [place] a call if you don't receive them in the next week or so. 3. We will get a chest x-ray. We will call you with results.  Return to clinic in 1 yr.

## 2015-05-27 NOTE — Progress Notes (Signed)
Subjective:    Patient ID: Patrick Scott, male    DOB: 26-Jun-1975, 40 y.o.   MRN: [id number]  HPI Pt returns to office as f/u on his OSA.   ROV (05/27/15)  Pt is here for f/u on his osa. Feels better using it. More energy. Less sleepiness. DM has been uncontrolled. Has not been admitted nor has been on abx since last seen. Pt smokes 1PPD. Nicotine patches did not work. Recent SOB.    Review of Systems  Constitutional: Negative.   HENT: Negative.   Eyes: Negative.   Respiratory: Positive for shortness of breath.   Cardiovascular: Negative.   Gastrointestinal: Negative.   Endocrine: Negative.   Genitourinary: Negative.   Musculoskeletal: Negative.   Skin: Negative.   Allergic/Immunologic: Negative.   Neurological: Negative.   Hematological: Negative.   Psychiatric/Behavioral: Negative.   All other systems reviewed and are negative.  Past Medical History  Diagnosis Date  . Kidney stone   . Hypertension   . Diabetes mellitus   . Small bowel obstruction ([place])   . Hyperlipidemia   . 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  (-) DVT.   Family History  Problem Relation Age of Onset  . Cancer Father     deceased at age 31     Past Surgical History  Procedure Laterality Date  . Kidney stone removal    . Wrist surgery      right  . Colon resection  1988  . Appendectomy  1988    Social History   Social History  . Marital Status: Married    Spouse Name: Paulino Rily  . Number of Children: N/A  . Years of Education: N/A   Occupational History  . Claims Adjuster    Social History Main Topics  . Smoking status: Current Every Day Smoker -- 1.00 packs/day for 13 years    Types: Cigarettes  . Smokeless tobacco: Not on file  . Alcohol Use: Yes     Comment: socially  . Drug Use: No  . Sexual Activity: Not on file   Other Topics Concern  . Not on file   Social History Narrative     Allergies  Allergen Reactions  . Mepergan  [Meperidine-Promethazine]     hallucinations     Outpatient Prescriptions Prior to Visit  Medication Sig Dispense Refill  . aspirin 81 MG tablet Take 81 mg by mouth daily.      . cetirizine (ZYRTEC) 10 MG tablet Take 10 mg by mouth daily.    Marland Kitchen lisinopril (PRINIVIL,ZESTRIL) 10 MG tablet Take 10 mg by mouth daily.      Marland Kitchen LORazepam (ATIVAN) 0.5 MG tablet Take 0.5 mg by mouth 2 (two) times daily.     . metFORMIN (GLUCOPHAGE) 1000 MG tablet Take 1,000 mg by mouth 2 (two) times daily with a meal.      . oxyCODONE-acetaminophen (PERCOCET/ROXICET) 5-325 MG per tablet Take 2 tablets by mouth every 6 (six) hours as needed for moderate pain or severe pain. 1-2 tablets every 4-6 hours as needed for pain    . PARoxetine (PAXIL) 20 MG tablet Take 20 mg by mouth every morning.    . pravastatin (PRAVACHOL) 20 MG tablet Take 20 mg by mouth daily.      Nelva Nay SOLOSTAR 300 UNIT/ML SOPN Inject 56 Units into the vein daily.   0  . glimepiride (AMARYL) 1 MG tablet Take 1 mg by mouth daily with breakfast.    .  ibuprofen (ADVIL,MOTRIN) 200 MG tablet Take 800 mg by mouth every 6 (six) hours as needed for headache.     No facility-administered medications prior to visit.   Meds ordered this encounter  Medications  . VICTOZA 18 MG/3ML SOPN    Sig: as directed.    Refill:  0         Objective:   Physical Exam  Vitals:  Filed Vitals:   05/27/15 1141  BP: 118/72  Pulse: 87  Height: 5\' 10"  (1.778 m)  Weight: 220 lb (99.791 kg)  SpO2: 96%    Constitutional/General:  Pleasant, well-nourished, well-developed, not in any distress,  Comfortably seating.  Well kempt  Body mass index is 31.57 kg/(m^2). Wt Readings from Last 3 Encounters:  05/27/15 220 lb (99.791 kg)  11/29/12 207 lb 9.6 oz (94.167 kg)  01/16/11 205 lb (92.987 kg)     HEENT: Pupils equal and reactive to light and accommodation. Anicteric sclerae. Normal nasal mucosa.   No oral  lesions,  mouth clear,  oropharynx clear, no postnasal  drip. (-) Oral thrush. No dental caries.  Airway - Mallampati class III  Neck: No masses. Midline trachea. No JVD, (-) LAD. (-) bruits appreciated.  Respiratory/Chest: Grossly normal chest. (-) deformity. (-) Accessory muscle use.  Symmetric expansion. (-) Tenderness on palpation.  Resonant on percussion.  Diminished BS on both lower lung zones. (-) wheezing, crackles, rhonchi (-) egophony  Cardiovascular: Regular rate and  rhythm, heart sounds normal, no murmur or gallops, no peripheral edema  Gastrointestinal:  Normal bowel sounds. Soft, non-tender. No hepatosplenomegaly.  (-) masses.   Musculoskeletal:  Normal muscle tone. Normal gait.   Extremities: Grossly normal. (-) clubbing, cyanosis.  (-) edema  Skin: (-) rash,lesions seen.   Neurological/Psychiatric : alert, oriented to time, place, person. Normal mood and affect            Assessment & Plan:  OSA (obstructive sleep apnea) NPSG 2008:  AHI 29/hr, pressure to 15cm.  Got autocpap 5-20 in 2015.  DL the past month 90%, AHI 4.5  Cont cpap. Feels better using it. More energy. Less sleepiness.  Exertional dyspnea Recent exertional dyspnea. Cont to smoke 1/2 PPD. Not known to have asthma or copd. Has DM.  H/O melanoma in 2004.  Plan : 1. CXR. May need chest ct scan if abn. 2. Hold off on pft. May need pft, abg, echo if more sx. Pt will call.   Tobacco user Pt and husband smoke. Plan to quit smoking in June -- after vacation. May try patches again.   Return to clinic in 1 yr, sooner if more sx.   Monica Becton, MD 05/27/2015, 12:25 PM [facility] Pager [phone] After 3 pm or if no answer, call [phone]

## 2015-05-27 NOTE — Assessment & Plan Note (Signed)
NPSG 2008:  AHI 29/hr, pressure to 15cm.  Got autocpap 5-20 in 2015.  DL the past month 90%, AHI 4.5  Cont cpap. Feels better using it. More energy. Less sleepiness.

## 2015-06-08 DIAGNOSIS — E119 Type 2 diabetes mellitus without complications: Secondary | ICD-10-CM | POA: Diagnosis not present

## 2015-06-17 ENCOUNTER — Encounter: Payer: Self-pay | Admitting: Pulmonary Disease

## 2015-07-15 DIAGNOSIS — G4733 Obstructive sleep apnea (adult) (pediatric): Secondary | ICD-10-CM | POA: Diagnosis not present

## 2015-08-10 DIAGNOSIS — E119 Type 2 diabetes mellitus without complications: Secondary | ICD-10-CM | POA: Diagnosis not present

## 2015-08-12 DIAGNOSIS — E119 Type 2 diabetes mellitus without complications: Secondary | ICD-10-CM | POA: Diagnosis not present

## 2015-08-12 DIAGNOSIS — E7800 Pure hypercholesterolemia, unspecified: Secondary | ICD-10-CM | POA: Diagnosis not present

## 2015-08-23 DIAGNOSIS — E119 Type 2 diabetes mellitus without complications: Secondary | ICD-10-CM | POA: Diagnosis not present

## 2015-09-10 DIAGNOSIS — E119 Type 2 diabetes mellitus without complications: Secondary | ICD-10-CM | POA: Diagnosis not present

## 2015-09-10 DIAGNOSIS — F419 Anxiety disorder, unspecified: Secondary | ICD-10-CM | POA: Diagnosis not present

## 2015-09-10 DIAGNOSIS — F329 Major depressive disorder, single episode, unspecified: Secondary | ICD-10-CM | POA: Diagnosis not present

## 2015-10-04 DIAGNOSIS — Z23 Encounter for immunization: Secondary | ICD-10-CM | POA: Diagnosis not present

## 2015-10-08 DIAGNOSIS — F419 Anxiety disorder, unspecified: Secondary | ICD-10-CM | POA: Diagnosis not present

## 2015-10-08 DIAGNOSIS — E119 Type 2 diabetes mellitus without complications: Secondary | ICD-10-CM | POA: Diagnosis not present

## 2015-10-08 DIAGNOSIS — F329 Major depressive disorder, single episode, unspecified: Secondary | ICD-10-CM | POA: Diagnosis not present

## 2015-10-08 DIAGNOSIS — I10 Essential (primary) hypertension: Secondary | ICD-10-CM | POA: Diagnosis not present

## 2015-10-09 DIAGNOSIS — Z7252 High risk homosexual behavior: Secondary | ICD-10-CM | POA: Diagnosis not present

## 2015-10-09 DIAGNOSIS — E119 Type 2 diabetes mellitus without complications: Secondary | ICD-10-CM | POA: Diagnosis not present

## 2015-10-20 DIAGNOSIS — G4733 Obstructive sleep apnea (adult) (pediatric): Secondary | ICD-10-CM | POA: Diagnosis not present

## 2015-11-03 DIAGNOSIS — F419 Anxiety disorder, unspecified: Secondary | ICD-10-CM | POA: Diagnosis not present

## 2015-11-08 DIAGNOSIS — E119 Type 2 diabetes mellitus without complications: Secondary | ICD-10-CM | POA: Diagnosis not present

## 2015-11-23 DIAGNOSIS — E119 Type 2 diabetes mellitus without complications: Secondary | ICD-10-CM | POA: Diagnosis not present

## 2015-11-24 DIAGNOSIS — F419 Anxiety disorder, unspecified: Secondary | ICD-10-CM | POA: Diagnosis not present

## 2015-11-25 DIAGNOSIS — E119 Type 2 diabetes mellitus without complications: Secondary | ICD-10-CM | POA: Diagnosis not present

## 2016-01-21 DIAGNOSIS — G4733 Obstructive sleep apnea (adult) (pediatric): Secondary | ICD-10-CM | POA: Diagnosis not present

## 2016-02-25 DIAGNOSIS — E119 Type 2 diabetes mellitus without complications: Secondary | ICD-10-CM | POA: Diagnosis not present

## 2016-02-25 DIAGNOSIS — J209 Acute bronchitis, unspecified: Secondary | ICD-10-CM | POA: Diagnosis not present

## 2016-05-17 ENCOUNTER — Encounter: Payer: Self-pay | Admitting: Podiatry

## 2016-05-17 ENCOUNTER — Ambulatory Visit (INDEPENDENT_AMBULATORY_CARE_PROVIDER_SITE_OTHER): Payer: BLUE CROSS/BLUE SHIELD

## 2016-05-17 ENCOUNTER — Ambulatory Visit (INDEPENDENT_AMBULATORY_CARE_PROVIDER_SITE_OTHER): Payer: BLUE CROSS/BLUE SHIELD | Admitting: Podiatry

## 2016-05-17 VITALS — Resp 16 | Ht 70.0 in | Wt 210.0 lb

## 2016-05-17 DIAGNOSIS — L280 Lichen simplex chronicus: Secondary | ICD-10-CM | POA: Diagnosis not present

## 2016-05-17 DIAGNOSIS — L84 Corns and callosities: Secondary | ICD-10-CM | POA: Diagnosis not present

## 2016-05-17 DIAGNOSIS — D492 Neoplasm of unspecified behavior of bone, soft tissue, and skin: Secondary | ICD-10-CM

## 2016-05-17 DIAGNOSIS — M7989 Other specified soft tissue disorders: Secondary | ICD-10-CM | POA: Diagnosis not present

## 2016-05-17 NOTE — Progress Notes (Signed)
   Subjective:    Patient ID: Patrick Scott, male    DOB: 01-Feb-1975, 41 y.o.   MRN: [id number]  HPI Chief Complaint  Patient presents with  . Painful lesion    Right foot; great toe-lateral; pt stated, "Was here in October 2004 and had a cancerous lesion on the same foot and same toe; wants to have checked" ; pt Diabetic type 2; Sugar=130 at 11:52 am; A1C=6.4 (11/23/15)  . Toe Pain    Right foot, 2nd toe; redness and itching; x6 months      Review of Systems  Gastrointestinal: Positive for abdominal distention.  All other systems reviewed and are negative.      Objective:   Physical Exam        Assessment & Plan:

## 2016-05-18 ENCOUNTER — Telehealth: Payer: Self-pay | Admitting: *Deleted

## 2016-05-18 NOTE — Telephone Encounter (Addendum)
Pt states he can not remember the instructions Dr. Paulla Dolly gave him after the biopsies. I told pt to shower as usual, before getting out of the shower take a clean washcloth and wet it, squeeze some liquid antibacterial soap on the cloth and cleanse over the biopsy areas, rinse well and pat dry, cover fabric bandaids with lightly coated neosporin after each cleansing. I told pt the cleansing and dressings may be performed daily until the area gets a dry hard scab. Pt states the dressing was so tight the toes were turning blue and painful so he has already taken them off and will begin cleansing.05/24/2016-Sherry - Bako states pathologist would like to know if Dr. Paulla Dolly is looking for pigmented lesion or soft tissue lesion and please send clinical photos.05/25/2016-Dr. 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 I informed Horald Pollen of Dr. Paulla Dolly statement and I informed Judeen Hammans we did not have clinical photos. Pt called for biopsy results. I informed pt that the results were not in, I would call after Dr. Paulla Dolly reviewed. 06/02/2016-Dr. Regal states the biopsy results of 05/17/2016 showed not evidence of malignancy in either specimen. I informed pt of Dr. Mellody Drown review of results. Pt states understanding.

## 2016-05-19 NOTE — Progress Notes (Signed)
Subjective:    Patient ID: Patrick Scott, male   DOB: 41 y.o.   MRN: [id number]   HPI patient presents stating he was seen by [place] approximately 14 years ago and we discovered cancer on his right big toe. In the last several weeks he's developed a small lesion on the inside of his right big toe and a lesion on top of his right second toe which are itchy but not painful. He is concerned about why he is developed these lesions and what they may be    Review of Systems  All other systems reviewed and are negative.       Objective:  Physical Exam  Cardiovascular: Intact distal pulses.   Musculoskeletal: Normal range of motion.  Neurological: He is alert.  Skin: Skin is warm.  Nursing note and vitals reviewed.  neurovascular status intact muscle strength was adequate range of motion within normal limits with patient found to have a lesion on the inside of the right big toe that measures approximately 3 x 3 mm with no proximal edema erythema drainage and has a mild dark discoloration associated with it and on top of the right second toe there is a small lesion measuring 2 x 2 mm that is red and it's orientation with no proximal irritation     Assessment:    Difficult to say the origin of these lesions even though with it short duration I am hopeful it is nothing of significant pathology but concerned due to past history     Plan:    H&P conditions reviewed. I went ahead today and I infiltrated hallux and second toe 60 mg like Marcaine mixture and utilizing a 3 mm punch biopsy I removed tissue from each area. I found the tissue to be organized and what appeared to be healthy but I can make complete determination and I did place in formalin and sent them separately for pathological evaluation. I will see him back in 2 weeks and we'll wait for the results of pathology and applied sterile dressings

## 2016-06-01 ENCOUNTER — Ambulatory Visit (INDEPENDENT_AMBULATORY_CARE_PROVIDER_SITE_OTHER): Payer: BLUE CROSS/BLUE SHIELD | Admitting: Podiatry

## 2016-06-01 ENCOUNTER — Encounter: Payer: Self-pay | Admitting: Podiatry

## 2016-06-01 DIAGNOSIS — D492 Neoplasm of unspecified behavior of bone, soft tissue, and skin: Secondary | ICD-10-CM

## 2016-06-02 NOTE — Progress Notes (Signed)
Subjective:    Patient ID: Patrick Scott, male   DOB: 41 y.o.   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

## 2016-06-09 DIAGNOSIS — G4733 Obstructive sleep apnea (adult) (pediatric): Secondary | ICD-10-CM | POA: Diagnosis not present

## 2016-09-06 DIAGNOSIS — G4733 Obstructive sleep apnea (adult) (pediatric): Secondary | ICD-10-CM | POA: Diagnosis not present

## 2016-09-12 DIAGNOSIS — G4733 Obstructive sleep apnea (adult) (pediatric): Secondary | ICD-10-CM | POA: Diagnosis not present

## 2016-12-19 ENCOUNTER — Emergency Department (HOSPITAL_COMMUNITY): Payer: No Typology Code available for payment source

## 2016-12-19 ENCOUNTER — Emergency Department (HOSPITAL_COMMUNITY)
Admission: EM | Admit: 2016-12-19 | Discharge: 2016-12-19 | Disposition: A | Discharge status: Home / Self Care | Payer: No Typology Code available for payment source | Attending: Emergency Medicine | Admitting: Emergency Medicine

## 2016-12-19 ENCOUNTER — Other Ambulatory Visit: Payer: Self-pay

## 2016-12-19 ENCOUNTER — Encounter (HOSPITAL_COMMUNITY): Payer: Self-pay

## 2016-12-19 DIAGNOSIS — M542 Cervicalgia: Secondary | ICD-10-CM | POA: Insufficient documentation

## 2016-12-19 DIAGNOSIS — Z79899 Other long term (current) drug therapy: Secondary | ICD-10-CM | POA: Diagnosis not present

## 2016-12-19 DIAGNOSIS — Z7982 Long term (current) use of aspirin: Secondary | ICD-10-CM | POA: Insufficient documentation

## 2016-12-19 DIAGNOSIS — F17210 Nicotine dependence, cigarettes, uncomplicated: Secondary | ICD-10-CM | POA: Diagnosis not present

## 2016-12-19 DIAGNOSIS — G44209 Tension-type headache, unspecified, not intractable: Secondary | ICD-10-CM

## 2016-12-19 DIAGNOSIS — E119 Type 2 diabetes mellitus without complications: Secondary | ICD-10-CM | POA: Diagnosis not present

## 2016-12-19 DIAGNOSIS — R51 Headache: Secondary | ICD-10-CM | POA: Insufficient documentation

## 2016-12-19 DIAGNOSIS — I10 Essential (primary) hypertension: Secondary | ICD-10-CM | POA: Diagnosis not present

## 2016-12-19 DIAGNOSIS — Z5321 Procedure and treatment not carried out due to patient leaving prior to being seen by health care provider: Secondary | ICD-10-CM

## 2016-12-19 MED ORDER — NAPROXEN 375 MG PO TABS: 375.0000 mg | ORAL_TABLET | Freq: Two times a day (BID) | ORAL | 0 refills | Status: DC

## 2016-12-19 MED ORDER — NAPROXEN 500 MG PO TABS
500.0000 mg | ORAL_TABLET | Freq: Once | ORAL | Status: AC
  Administered 2016-12-19: 500 mg via ORAL
  Filled 2016-12-19: qty 1

## 2016-12-19 MED ORDER — CYCLOBENZAPRINE HCL 10 MG PO TABS: 10.0000 mg | ORAL_TABLET | Freq: Two times a day (BID) | ORAL | 0 refills | Status: DC | PRN
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
## 2016-12-19 NOTE — ED Provider Notes (Cosign Needed)
Patient not in room. Staff unable to locate in the department or waiting room.    Etta Quill, NP 12/19/16 [phone]

## 2016-12-19 NOTE — ED Notes (Signed)
Patient reports he left due to unable to wait and has returned for treatment. There was no previous documentation on patient.
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
## 2017-07-16 DIAGNOSIS — K589 Irritable bowel syndrome without diarrhea: Secondary | ICD-10-CM | POA: Diagnosis not present

## 2017-07-16 DIAGNOSIS — R197 Diarrhea, unspecified: Secondary | ICD-10-CM | POA: Diagnosis not present

## 2017-07-16 DIAGNOSIS — R143 Flatulence: Secondary | ICD-10-CM | POA: Diagnosis not present

## 2017-08-29 ENCOUNTER — Telehealth: Payer: Self-pay | Admitting: Adult Health

## 2017-08-29 NOTE — Telephone Encounter (Signed)
Called and spoke with Patient. Patient stated that he needs new CPAP and he is a former Dr Harmon Pier Patient.  Patient stated that he wanted to get started with a new sleep doctor.  Appointment made 08/30/17, at 1100, with Dr Ander Slade. Nothing further at this time.

## 2017-08-30 ENCOUNTER — Ambulatory Visit: Payer: Managed Care, Other (non HMO) | Admitting: Pulmonary Disease

## 2017-09-19 ENCOUNTER — Ambulatory Visit: Payer: Self-pay | Admitting: Pulmonary Disease

## 2017-09-22 DIAGNOSIS — G4733 Obstructive sleep apnea (adult) (pediatric): Secondary | ICD-10-CM | POA: Diagnosis not present

## 2017-10-01 DIAGNOSIS — Z79899 Other long term (current) drug therapy: Secondary | ICD-10-CM | POA: Diagnosis not present

## 2021-03-14 ENCOUNTER — Emergency Department (HOSPITAL_COMMUNITY)
Admission: EM | Admit: 2021-03-14 | Discharge: 2021-03-15 | Disposition: A | Discharge status: Home / Self Care | Payer: 59 | Attending: Emergency Medicine | Admitting: Emergency Medicine

## 2021-03-14 ENCOUNTER — Other Ambulatory Visit: Payer: Self-pay

## 2021-03-14 ENCOUNTER — Emergency Department (HOSPITAL_COMMUNITY): Payer: 59

## 2021-03-14 ENCOUNTER — Encounter (HOSPITAL_COMMUNITY): Payer: Self-pay

## 2021-03-14 DIAGNOSIS — Z7982 Long term (current) use of aspirin: Secondary | ICD-10-CM | POA: Insufficient documentation

## 2021-03-14 DIAGNOSIS — R1084 Generalized abdominal pain: Secondary | ICD-10-CM

## 2021-03-14 DIAGNOSIS — R110 Nausea: Secondary | ICD-10-CM | POA: Diagnosis not present

## 2021-03-14 DIAGNOSIS — K5900 Constipation, unspecified: Secondary | ICD-10-CM | POA: Diagnosis not present

## 2021-03-14 DIAGNOSIS — I10 Essential (primary) hypertension: Secondary | ICD-10-CM | POA: Insufficient documentation

## 2021-03-14 DIAGNOSIS — Z79899 Other long term (current) drug therapy: Secondary | ICD-10-CM | POA: Diagnosis not present

## 2021-03-14 DIAGNOSIS — E119 Type 2 diabetes mellitus without complications: Secondary | ICD-10-CM | POA: Diagnosis not present

## 2021-03-14 LAB — CBC WITH DIFFERENTIAL/PLATELET
Abs Immature Granulocytes: 0.03 10*3/uL (ref 0.00–0.07)
Basophils Absolute: 0.1 10*3/uL (ref 0.0–0.1)
Basophils Relative: 1 %
Eosinophils Absolute: 0.1 10*3/uL (ref 0.0–0.5)
Eosinophils Relative: 1 %
HCT: 45.9 % (ref 39.0–52.0)
Hemoglobin: 16.1 g/dL (ref 13.0–17.0)
Immature Granulocytes: 0 %
Lymphocytes Relative: 37 %
Lymphs Abs: 3.8 10*3/uL (ref 0.7–4.0)
MCH: 31.1 pg (ref 26.0–34.0)
MCHC: 35.1 g/dL (ref 30.0–36.0)
MCV: 88.8 fL (ref 80.0–100.0)
Monocytes Absolute: 0.6 10*3/uL (ref 0.1–1.0)
Monocytes Relative: 6 %
Neutro Abs: 5.6 10*3/uL (ref 1.7–7.7)
Neutrophils Relative %: 55 %
Platelets: 221 10*3/uL (ref 150–400)
RBC: 5.17 MIL/uL (ref 4.22–5.81)
RDW: 12.1 % (ref 11.5–15.5)
WBC: 10.2 10*3/uL (ref 4.0–10.5)
nRBC: 0 % (ref 0.0–0.2)

## 2021-03-14 LAB — URINALYSIS, ROUTINE W REFLEX MICROSCOPIC
Bacteria, UA: NONE SEEN
Bilirubin Urine: NEGATIVE
Glucose, UA: >=500 mg/dL — AB
Hgb urine dipstick: NEGATIVE
Ketones, ur: 20 mg/dL — AB
Leukocytes,Ua: NEGATIVE
Nitrite: NEGATIVE
Protein, ur: NEGATIVE mg/dL
Specific Gravity, Urine: 1.033 — ABNORMAL HIGH (ref 1.005–1.030)
pH: 5 (ref 5.0–8.0)

## 2021-03-14 LAB — COMPREHENSIVE METABOLIC PANEL
ALT: 38 U/L (ref 0–44)
AST: 24 U/L (ref 15–41)
Albumin: 4.3 g/dL (ref 3.5–5.0)
Alkaline Phosphatase: 90 U/L (ref 38–126)
Anion gap: 9 (ref 5–15)
BUN: 19 mg/dL (ref 6–20)
CO2: 22 mmol/L (ref 22–32)
Calcium: 9.1 mg/dL (ref 8.9–10.3)
Chloride: 99 mmol/L (ref 98–111)
Creatinine, Ser: 0.79 mg/dL (ref 0.61–1.24)
GFR, Estimated: >60 mL/min (ref >60)
Glucose, Bld: 246 mg/dL — ABNORMAL HIGH (ref 70–99)
Potassium: 4.1 mmol/L (ref 3.5–5.1)
Sodium: 130 mmol/L — ABNORMAL LOW (ref 135–145)
Total Bilirubin: 0.8 mg/dL (ref 0.3–1.2)
Total Protein: 7.5 g/dL (ref 6.5–8.1)

## 2021-03-14 LAB — LIPASE, BLOOD: Lipase: 44 U/L (ref 11–51)

## 2021-03-14 MED ORDER — KETOROLAC TROMETHAMINE 15 MG/ML IJ SOLN
15.0000 mg | Freq: Once | INTRAMUSCULAR | Status: AC
  Administered 2021-03-15: 15 mg via INTRAVENOUS
  Filled 2021-03-14: qty 1

## 2021-03-14 MED ORDER — IOHEXOL 300 MG/ML  SOLN
100.0000 mL | Freq: Once | INTRAMUSCULAR | Status: AC | PRN
  Administered 2021-03-14: 100 mL via INTRAVENOUS

## 2021-03-14 MED ORDER — DICYCLOMINE HCL 10 MG/ML IM SOLN
20.0000 mg | Freq: Once | INTRAMUSCULAR | Status: AC
  Administered 2021-03-15: 20 mg via INTRAMUSCULAR
  Filled 2021-03-14: qty 2

## 2021-03-14 MED ORDER — DICYCLOMINE HCL 20 MG PO TABS: 20.0000 mg | ORAL_TABLET | Freq: Two times a day (BID) | ORAL | 0 refills | Status: DC | PRN

## 2021-03-14 MED ORDER — POLYETHYLENE GLYCOL 3350 17 GM/SCOOP PO POWD: 17.0000 g | Freq: Two times a day (BID) | ORAL | 1 refills | Status: AC

## 2021-03-14 NOTE — ED Triage Notes (Signed)
Patient reports a history of bowel blockage. Patient states he keeps up with his stools and is very mindful of the same. Patient states no BM 3 days. Patient took dulcolax and a suppository yesterday then had several small BM's. Patient states today he is having abdominal pain and nausea and no BM today.  Patient states he is regular and has lots of stool when he goes.
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
## 2021-03-14 NOTE — Discharge Instructions (Addendum)
Your work-up in the emergency department today was reassuring.  We recommend the use of MiraLAX twice a day until soft stools.  You may take Bentyl as prescribed for management of abdominal pain or cramping.  Use this an addition to Tylenol or Motrin.  Follow-up with your primary care doctor to ensure symptomatic improvement.  Return to the ED if symptoms persist or worsen.

## 2021-03-25 NOTE — ED Provider Notes (Signed)
[facility] DEPT Provider Note   CSN: [id number] Arrival date & time: 03/14/21  1703     History  Chief Complaint  Patient presents with   Abdominal Pain   Nausea    Patrick Scott is a 46 y.o. male.  46 y/o male with hx of DM, HLD, HTN, SBO presents to the ED for evaluation of generalized abdominal pain.  He reports that symptoms have been ongoing over the past 3 days.  They have been constant and gradually worsening, associated with constipation, increasing nausea.  He has tried over-the-counter suppositories as well as Dulcolax and enemas without symptomatic relief.  He does continue to pass flatus, but has not had a normal bowel movement in the past 3 days.  He expresses concern that he may have a bowel obstruction as he has history of this back in 1988.  No fevers, chills, vomiting, urinary symptoms, sick contacts.  Abdominal SHx notable for colon resection and appendectomy.   Abdominal Pain     Home Medications Prior to Admission medications   Medication Sig Start Date End Date Taking? Authorizing Provider  dicyclomine (BENTYL) 20 MG tablet Take 1 tablet (20 mg total) by mouth every 12 (twelve) hours as needed (for abdominal pain/cramping). 03/14/21  Yes Antonietta Breach, PA-C  polyethylene glycol powder (GLYCOLAX/MIRALAX) 17 GM/SCOOP powder Take 17 g by mouth 2 (two) times daily. Until daily soft stools  OTC 03/14/21  Yes Antonietta Breach, PA-C  aspirin 81 MG tablet Take 81 mg by mouth daily.      [provider]  cetirizine (ZYRTEC) 10 MG tablet Take 10 mg by mouth daily.    [provider]  cyclobenzaprine (FLEXERIL) 10 MG tablet Take 1 tablet (10 mg total) by mouth 2 (two) times daily as needed for muscle spasms. 12/19/16   Etta Quill, NP  lisinopril (PRINIVIL,ZESTRIL) 10 MG tablet Take 10 mg by mouth daily.      [provider]  LORazepam (ATIVAN) 0.5 MG tablet Take 0.5 mg by mouth 2 (two) times daily.     [provider]  naproxen (NAPROSYN) 375 MG tablet Take 1 tablet (375 mg total) by mouth 2 (two) times daily. 12/19/16   Etta Quill, NP  oxyCODONE-acetaminophen (PERCOCET/ROXICET) 5-325 MG per tablet Take 2 tablets by mouth every 6 (six) hours as needed for moderate pain or severe pain. 1-2 tablets every 4-6 hours as needed for pain    [provider]  PARoxetine (PAXIL) 20 MG tablet Take 20 mg by mouth every morning.    [provider]  pravastatin (PRAVACHOL) 20 MG tablet Take 20 mg by mouth daily.      [provider]  sertraline (ZOLOFT) 100 MG tablet  04/26/16   [provider]  TOUJEO SOLOSTAR 300 UNIT/ML SOPN Inject 56 Units into the vein daily.  03/15/15   [provider]  VICTOZA 18 MG/3ML SOPN as directed. 05/17/15   [provider]  XIGDUO XR 10-998 MG TB24  04/26/16   [provider]      Allergies    Mepergan [meperidine-promethazine]    Review of Systems   Review of Systems  Gastrointestinal:  Positive for abdominal pain.  Ten systems reviewed and are negative for acute change, except as noted in the HPI.    Physical Exam Updated Vital Signs BP 121/75    Pulse 80    Temp 98.1 F (36.7 C) (Oral)    Resp 18    Ht 5'  10" (1.778 m)    Wt 82.1 kg    SpO2 99%    BMI 25.97 kg/m   Physical Exam Vitals and nursing note reviewed.  Constitutional:      General: He is not in acute distress.    Appearance: He is well-developed. He is not diaphoretic.     Comments: Nontoxic appearing male, in NAD  HENT:     Head: Normocephalic and atraumatic.  Eyes:     General: No scleral icterus.    Conjunctiva/sclera: Conjunctivae normal.  Cardiovascular:     Rate and Rhythm: Normal rate and regular rhythm.     Pulses: Normal pulses.  Pulmonary:     Effort: Pulmonary effort is normal. No respiratory distress.     Breath sounds: No stridor. No wheezing.     Comments: Respirations even and unlabored Abdominal:     Palpations:  Abdomen is soft. There is no mass.     Tenderness: There is no guarding.     Comments: Abdomen soft without focal TTP.   Musculoskeletal:        General: Normal range of motion.     Cervical back: Normal range of motion.  Skin:    General: Skin is warm and dry.     Coloration: Skin is not pale.     Findings: No erythema or rash.  Neurological:     Mental Status: He is alert and oriented to person, place, and time.     Coordination: Coordination normal.  Psychiatric:        Behavior: Behavior normal.    ED Results / Procedures / Treatments   Labs (all labs ordered are listed, but only abnormal results are displayed) Labs Reviewed  COMPREHENSIVE METABOLIC PANEL - Abnormal; Notable for the following components:      Result Value   Sodium 130 (*)    Glucose, Bld 246 (*)    All other components within normal limits  URINALYSIS, ROUTINE W REFLEX MICROSCOPIC - Abnormal; Notable for the following components:   Color, Urine STRAW (*)    Specific Gravity, Urine 1.033 (*)    Glucose, UA >=500 (*)    Ketones, ur 20 (*)    All other components within normal limits  CBC WITH DIFFERENTIAL/PLATELET  LIPASE, BLOOD    EKG None  Radiology CT ABDOMEN PELVIS W CONTRAST  Result Date: 03/14/2021 CLINICAL DATA:  Generalized abdominal pain and nausea. EXAM: CT ABDOMEN AND PELVIS WITH CONTRAST TECHNIQUE: Multidetector CT imaging of the abdomen and pelvis was performed using the standard protocol following bolus administration of intravenous contrast. RADIATION DOSE REDUCTION: This exam was performed according to the departmental dose-optimization program which includes automated exposure control, adjustment of the mA and/or kV according to patient size and/or use of iterative reconstruction technique. CONTRAST:  169mL OMNIPAQUE IOHEXOL 300 MG/ML  SOLN COMPARISON:  November 06, 2019 FINDINGS: Lower chest: No acute abnormality. Hepatobiliary: There is mild diffuse fatty infiltration of the liver  parenchyma no focal liver abnormality is seen. No gallstones, gallbladder wall thickening, or biliary dilatation. Pancreas: Unremarkable. No pancreatic ductal dilatation or surrounding inflammatory changes. Spleen: Normal in size without focal abnormality. Adrenals/Urinary Tract: Adrenal glands are unremarkable. Kidneys are normal in size, without obstructing renal calculi or hydronephrosis. A 1.2 cm diameter simple cyst is seen within the anterior aspect of the lower pole of the left kidney. Numerous bilateral 2 mm nonobstructing renal calculi are seen. Bladder is unremarkable. Stomach/Bowel: Stomach is within normal limits. The appendix is surgically absent. No evidence  of bowel wall thickening, distention, or inflammatory changes. Vascular/Lymphatic: No significant vascular findings are present. No enlarged abdominal or pelvic lymph nodes. Reproductive: Prostate is unremarkable. Other: No abdominal wall hernia or abnormality. No abdominopelvic ascites. Musculoskeletal: No acute or significant osseous findings. IMPRESSION: 1. Numerous bilateral 2 mm nonobstructing renal calculi. 2. Hepatic steatosis. Electronically Signed   By: Virgina Norfolk M.D.   On: 03/14/2021 20:55     Procedures Procedures    Medications Ordered in ED Medications  iohexol (OMNIPAQUE) 300 MG/ML solution 100 mL (100 mLs Intravenous Contrast Given 03/14/21 2037)  dicyclomine (BENTYL) injection 20 mg (20 mg Intramuscular Given 03/15/21 0025)  ketorolac (TORADOL) 15 MG/ML injection 15 mg (15 mg Intravenous Given 03/15/21 0023)    ED Course/ Medical Decision Making/ A&P                           Medical Decision Making Risk Prescription drug management.   This patient presents to the ED for concern of abdominal pain, this involves an extensive number of treatment options, and is a complaint that carries with it a high risk of complications and morbidity.  The differential diagnosis includes constipation vs ileus vs SBO vs  volvulus vs adult intussusception vs enteritis/colitis vs diverticulitis vs perforation.   Co morbidities that complicate the patient evaluation  Hx of SBO, colonic resection DM HTN   Lab Tests:  I Ordered, and personally interpreted labs.  The pertinent results include:  hyperglycemia of 246 without evidence of DKA. No elevated WBCs, no electrolyte derangements. Liver and kidney function preserved. Normal lipase. UA negative for UTI, suggestive of mild dehydration.    Imaging Studies ordered:  I ordered imaging studies including CT abdomen/pelvis w/contrast  I independently visualized and interpreted imaging which showed nonobstructing renal calculi without evidence of SBO I agree with the radiologist interpretation   Cardiac Monitoring:  The patient was maintained on a cardiac monitor.  I personally viewed and interpreted the cardiac monitored which showed an underlying rhythm of: NSR   Medicines ordered and prescription drug management:  I ordered medication including Toradol and Bentyl for abdominal pain/cramping  Reevaluation of the patient after these medicines showed that the patient  has remained stable I have reviewed the patients home medicines and have made adjustments as needed   Problem List / ED Course:  Constipation, uncomplicated. Patient offered enema in the ED for continued symptomatic improvement which he declines.   Reevaluation:  After the interventions noted above, I reevaluated the patient and found that they have :stayed the same   Social Determinants of Health:  Insured    Dispostion:  After consideration of the diagnostic results and the patients response to treatment, I feel that the patent would benefit from initiation of bowel regimen for constipation; counseled on use of Miralax BID until regular stools, increase in dietary fiber. Given Rx for Bentyl for management of abdominal pain/cramping which may be taken with Tylenol or ibuprofen.  Encouraged close PCP follow up for recheck. Return precautions discussed and provided. Patient discharged in stable condition with no unaddressed concerns.         Final Clinical Impression(s) / ED Diagnoses Final diagnoses:  Generalized abdominal pain  Constipation, unspecified constipation type    Rx / DC Orders ED Discharge Orders          Ordered    polyethylene glycol powder (GLYCOLAX/MIRALAX) 17 GM/SCOOP powder  2 times daily  03/14/21 2358    dicyclomine (BENTYL) 20 MG tablet  Every 12 hours PRN        03/14/21 2358              Antonietta Breach, PA-C 99/06/89 3406    Gray, Eden P, DO 84/03/35 [phone]

## 2021-08-01 ENCOUNTER — Encounter (HOSPITAL_COMMUNITY): Payer: Self-pay

## 2021-08-01 ENCOUNTER — Emergency Department (HOSPITAL_COMMUNITY)
Admission: EM | Admit: 2021-08-01 | Discharge: 2021-08-01 | Disposition: A | Discharge status: Home / Self Care | Payer: 59 | Attending: Emergency Medicine | Admitting: Emergency Medicine

## 2021-08-01 DIAGNOSIS — Z794 Long term (current) use of insulin: Secondary | ICD-10-CM | POA: Insufficient documentation

## 2021-08-01 DIAGNOSIS — Z7982 Long term (current) use of aspirin: Secondary | ICD-10-CM | POA: Insufficient documentation

## 2021-08-01 DIAGNOSIS — F329 Major depressive disorder, single episode, unspecified: Secondary | ICD-10-CM | POA: Insufficient documentation

## 2021-08-01 DIAGNOSIS — E7800 Pure hypercholesterolemia, unspecified: Secondary | ICD-10-CM | POA: Insufficient documentation

## 2021-08-01 DIAGNOSIS — I10 Essential (primary) hypertension: Secondary | ICD-10-CM | POA: Insufficient documentation

## 2021-08-01 DIAGNOSIS — E039 Hypothyroidism, unspecified: Secondary | ICD-10-CM | POA: Insufficient documentation

## 2021-08-01 DIAGNOSIS — R531 Weakness: Secondary | ICD-10-CM | POA: Diagnosis present

## 2021-08-01 DIAGNOSIS — Z7984 Long term (current) use of oral hypoglycemic drugs: Secondary | ICD-10-CM | POA: Diagnosis not present

## 2021-08-01 DIAGNOSIS — E119 Type 2 diabetes mellitus without complications: Secondary | ICD-10-CM | POA: Insufficient documentation

## 2021-08-01 DIAGNOSIS — E1165 Type 2 diabetes mellitus with hyperglycemia: Secondary | ICD-10-CM | POA: Insufficient documentation

## 2021-08-01 LAB — COMPREHENSIVE METABOLIC PANEL
ALT: 41 U/L (ref 0–44)
AST: 24 U/L (ref 15–41)
Albumin: 4.4 g/dL (ref 3.5–5.0)
Alkaline Phosphatase: 94 U/L (ref 38–126)
Anion gap: 11 (ref 5–15)
BUN: 21 mg/dL — ABNORMAL HIGH (ref 6–20)
CO2: 21 mmol/L — ABNORMAL LOW (ref 22–32)
Calcium: 9.3 mg/dL (ref 8.9–10.3)
Chloride: 102 mmol/L (ref 98–111)
Creatinine, Ser: 0.9 mg/dL (ref 0.61–1.24)
GFR, Estimated: >60 mL/min (ref >60)
Glucose, Bld: 202 mg/dL — ABNORMAL HIGH (ref 70–99)
Potassium: 4.3 mmol/L (ref 3.5–5.1)
Sodium: 134 mmol/L — ABNORMAL LOW (ref 135–145)
Total Bilirubin: 0.8 mg/dL (ref 0.3–1.2)
Total Protein: 7.7 g/dL (ref 6.5–8.1)

## 2021-08-01 LAB — URINALYSIS, ROUTINE W REFLEX MICROSCOPIC
Bacteria, UA: NONE SEEN
Bilirubin Urine: NEGATIVE
Glucose, UA: >=500 mg/dL — AB
Hgb urine dipstick: NEGATIVE
Ketones, ur: NEGATIVE mg/dL
Leukocytes,Ua: NEGATIVE
Nitrite: NEGATIVE
Protein, ur: NEGATIVE mg/dL
Specific Gravity, Urine: 1.029 (ref 1.005–1.030)
pH: 5 (ref 5.0–8.0)

## 2021-08-01 LAB — CBC WITH DIFFERENTIAL/PLATELET
Abs Immature Granulocytes: 0.03 10*3/uL (ref 0.00–0.07)
Basophils Absolute: 0 10*3/uL (ref 0.0–0.1)
Basophils Relative: 0 %
Eosinophils Absolute: 0.2 10*3/uL (ref 0.0–0.5)
Eosinophils Relative: 2 %
HCT: 45.6 % (ref 39.0–52.0)
Hemoglobin: 15.6 g/dL (ref 13.0–17.0)
Immature Granulocytes: 0 %
Lymphocytes Relative: 29 %
Lymphs Abs: 3.1 10*3/uL (ref 0.7–4.0)
MCH: 30.8 pg (ref 26.0–34.0)
MCHC: 34.2 g/dL (ref 30.0–36.0)
MCV: 90.1 fL (ref 80.0–100.0)
Monocytes Absolute: 0.7 10*3/uL (ref 0.1–1.0)
Monocytes Relative: 6 %
Neutro Abs: 7 10*3/uL (ref 1.7–7.7)
Neutrophils Relative %: 63 %
Platelets: 182 10*3/uL (ref 150–400)
RBC: 5.06 MIL/uL (ref 4.22–5.81)
RDW: 12.3 % (ref 11.5–15.5)
WBC: 11 10*3/uL — ABNORMAL HIGH (ref 4.0–10.5)
nRBC: 0 % (ref 0.0–0.2)

## 2021-08-01 LAB — BETA-HYDROXYBUTYRIC ACID: Beta-Hydroxybutyric Acid: 0.14 mmol/L (ref 0.05–0.27)

## 2021-08-01 LAB — CBG MONITORING, ED: Glucose-Capillary: 210 mg/dL — ABNORMAL HIGH (ref 70–99)

## 2021-08-01 LAB — CK: Total CK: 93 U/L (ref 49–397)

## 2021-08-01 LAB — LACTIC ACID, PLASMA: Lactic Acid, Venous: 2.2 mmol/L (ref 0.5–1.9)

## 2021-08-01 LAB — OSMOLALITY: Osmolality: 290 mOsm/kg (ref 275–295)

## 2021-08-01 MED ORDER — ONDANSETRON HCL 4 MG/2ML IJ SOLN
4.0000 mg | Freq: Once | INTRAMUSCULAR | Status: AC
Start: 2021-08-01 — End: 2021-08-01
  Administered 2021-08-01: 4 mg via INTRAVENOUS
  Filled 2021-08-01: qty 2

## 2021-08-01 MED ORDER — SODIUM CHLORIDE 0.9 % IV BOLUS
1000.0000 mL | Freq: Once | INTRAVENOUS | Status: AC
  Administered 2021-08-01: 1000 mL via INTRAVENOUS

## 2021-08-01 NOTE — Discharge Instructions (Addendum)
Findings today did not show specific problems from your medications.  Your lactic acid is mildly elevated at 2.2.  This is not enough to cause your symptoms.  Continue taking your usual medicines and drink a lot of fluids.  Use Tylenol every 4 hours as needed for pain.  Follow-up with your doctor for further care and treatment

## 2021-08-01 NOTE — ED Provider Notes (Signed)
[facility] DEPT Provider Note   CSN: [id number] Arrival date & time: 08/01/21  3267     History {Add pertinent medical, surgical, social history, OB history to HPI:1} Chief Complaint  Patient presents with   Weakness    Patrick Scott is a 46 y.o. male.  HPI Patient complains of muscle aching, nausea and weakness, for several days.  He is concerned that he is having a medication reaction.  His physician has been changing his medications recently.  He denies fever, chills, vomiting, cough, dysuria, urinary frequency, diarrhea, paresthesia or focal weakness.    Home Medications Prior to Admission medications   Medication Sig Start Date End Date Taking? Authorizing Provider  aspirin 81 MG tablet Take 81 mg by mouth daily.      [provider]  cetirizine (ZYRTEC) 10 MG tablet Take 10 mg by mouth daily.    [provider]  cyclobenzaprine (FLEXERIL) 10 MG tablet Take 1 tablet (10 mg total) by mouth 2 (two) times daily as needed for muscle spasms. 12/19/16   Etta Quill, NP  dicyclomine (BENTYL) 20 MG tablet Take 1 tablet (20 mg total) by mouth every 12 (twelve) hours as needed (for abdominal pain/cramping). 03/14/21   Antonietta Breach, PA-C  lisinopril (PRINIVIL,ZESTRIL) 10 MG tablet Take 10 mg by mouth daily.      [provider]  LORazepam (ATIVAN) 0.5 MG tablet Take 0.5 mg by mouth 2 (two) times daily.     [provider]  naproxen (NAPROSYN) 375 MG tablet Take 1 tablet (375 mg total) by mouth 2 (two) times daily. 12/19/16   Etta Quill, NP  oxyCODONE-acetaminophen (PERCOCET/ROXICET) 5-325 MG per tablet Take 2 tablets by mouth every 6 (six) hours as needed for moderate pain or severe pain. 1-2 tablets every 4-6 hours as needed for pain    [provider]  PARoxetine (PAXIL) 20 MG tablet Take 20 mg by mouth every morning.    [provider]  polyethylene glycol powder (GLYCOLAX/MIRALAX) 17 GM/SCOOP  powder Take 17 g by mouth 2 (two) times daily. Until daily soft stools  OTC 03/14/21   Antonietta Breach, PA-C  pravastatin (PRAVACHOL) 20 MG tablet Take 20 mg by mouth daily.      [provider]  sertraline (ZOLOFT) 100 MG tablet  04/26/16   [provider]  TOUJEO SOLOSTAR 300 UNIT/ML SOPN Inject 56 Units into the vein daily.  03/15/15   [provider]  VICTOZA 18 MG/3ML SOPN as directed. 05/17/15   [provider]  XIGDUO XR 10-998 MG TB24  04/26/16   [provider]      Allergies    Mepergan [meperidine-promethazine]    Review of Systems   Review of Systems  Physical Exam Updated Vital Signs BP 124/83 (BP Location: Left Arm)   Pulse 90   Temp 97.9 F (36.6 C) (Oral)   Resp 18   SpO2 96%  Physical Exam Vitals and nursing note reviewed.  Constitutional:      General: He is not in acute distress.    Appearance: He is well-developed. He is not ill-appearing or diaphoretic.  HENT:     Head: Normocephalic and atraumatic.     Right Ear: External ear normal.     Left Ear: External ear normal.  Eyes:     Conjunctiva/sclera: Conjunctivae normal.     Pupils: Pupils are equal, round, and reactive to light.  Neck:     Trachea: Phonation normal.  Cardiovascular:  Rate and Rhythm: Normal rate and regular rhythm.     Heart sounds: Normal heart sounds.  Pulmonary:     Effort: Pulmonary effort is normal. No respiratory distress.     Breath sounds: Normal breath sounds. No stridor.  Abdominal:     General: There is no distension.     Palpations: Abdomen is soft. There is no mass.     Tenderness: There is no abdominal tenderness.  Musculoskeletal:        General: Normal range of motion.     Cervical back: Normal range of motion and neck supple.  Skin:    General: Skin is warm and dry.  Neurological:     Mental Status: He is alert and oriented to person, place, and time.     Cranial Nerves: No cranial nerve deficit.     Sensory: No  sensory deficit.     Motor: No abnormal muscle tone.     Coordination: Coordination normal.  Psychiatric:        Mood and Affect: Mood normal.        Behavior: Behavior normal.        Thought Content: Thought content normal.        Judgment: Judgment normal.     ED Results / Procedures / Treatments   Labs (all labs ordered are listed, but only abnormal results are displayed) Labs Reviewed  COMPREHENSIVE METABOLIC PANEL  CBC WITH DIFFERENTIAL/PLATELET  URINALYSIS, ROUTINE W REFLEX MICROSCOPIC  CK    EKG None  Radiology No results found.  Procedures Procedures  {Document cardiac monitor, telemetry assessment procedure when appropriate:1}  Medications Ordered in ED Medications  sodium chloride 0.9 % bolus 1,000 mL (has no administration in time range)    ED Course/ Medical Decision Making/ A&P                           Medical Decision Making Patient with type 2 diabetes, on multiple medications including insulin and oral agents, presenting for muscle achiness.  No known trauma.  No prolonged immobilization.  He presents amatory from home.  Amount and/or Complexity of Data Reviewed Independent Historian:     Details: He is a cogent historian Labs: ordered.  Risk Prescription drug management.   ***  {Document critical care time when appropriate:1} {Document review of labs and clinical decision tools ie heart score, Chads2Vasc2 etc:1}  {Document your independent review of radiology images, and any outside records:1} {Document your discussion with family members, caretakers, and with consultants:1} {Document social determinants of health affecting pt's care:1} {Document your decision making why or why not admission, treatments were needed:1} Final Clinical Impression(s) / ED Diagnoses Final diagnoses:  None    Rx / DC Orders ED Discharge Orders     None

## 2021-08-01 NOTE — ED Triage Notes (Signed)
Pt arrived via POV, c/o generalized weakness, and muscle pain throughout body since yesterday. Endorses some nausea. Denies any vomiting, diarrhea, fever, chills.

## 2021-08-08 ENCOUNTER — Encounter: Payer: Self-pay | Admitting: Primary Care

## 2021-08-08 ENCOUNTER — Ambulatory Visit (INDEPENDENT_AMBULATORY_CARE_PROVIDER_SITE_OTHER): Payer: 59 | Admitting: Primary Care

## 2021-08-08 VITALS — BP 116/68 | HR 86 | Temp 97.9°F | Ht 70.0 in | Wt 190.2 lb

## 2021-08-08 DIAGNOSIS — Z87891 Personal history of nicotine dependence: Secondary | ICD-10-CM

## 2021-08-08 DIAGNOSIS — G4733 Obstructive sleep apnea (adult) (pediatric): Secondary | ICD-10-CM

## 2021-08-08 NOTE — Progress Notes (Signed)
$'@Patient'C$  ID: Patrick Scott, male    DOB: [birth year], 46 y.o.   MRN: [id number]  Chief Complaint  Patient presents with   Consult    Pt states he has sleep apnea since 2013. Pt thinks his machine is set to too much pressure.     Referring provider: Jani Gravel, MD  HPI: 46 year old male, current smoker. PMH significant for HTN, OSA, hypothyroidism, hyperlipidemia, type 2 diabetes, depression. NPSG 2008, AHI 29/hr. CPAP auto 5-20cm h20.   08/08/2021 Patient presents today for sleep consult. He has a diagnosis of sleep apnea, sleep study in 2008 showed severe OSA. He is a former Patrick Scott patient, last seen in office May 4th 2017. Previously on auto CPAP 5-20cm h20. Needs new CPAP.   Patient has CPAP and remains complaint with use. Download from his machine showed 87% compliance over the last 90 days with average usage 5 hours 3 mins. Current pressure setting auto 5-20cm h20. He has lost 20 lbs the last two years. He reports waking up with large amounts of air in his stomach in the morning requiring him to belch to get relief. He is on metformin which he feels contributes some to gas/bloating. There are some nights he can not wear CPAP d/t nasal congestion and he has been told by his partner that he still snores. He uses resmed quatro air full face mask. DME is [employer]. CPAP machine is > 47 years old.   Airview download 05/10/21-08/07/21 Usage 78/90 days (87%); 65 days (72%) > 4 hours Average usage 5 hours 3 mins Pressure 5-20cm h20 (16.3cm h20-95%) Airleaks 16.3L/min AHI 2.8  Sleep questionnaire Symptoms-  Hx sleep apnea/ Snoring Prior sleep study- 2008 Bedtime- 10-11pm Time to fall asleep- 30 mins  Nocturnal awakenings- 2-3 times Out of bed/start of day- 6:30am Weight changes- lost 20 lbs Do you operate heavy machinery- No Do you currently wear CPAP- Yes Do you current wear oxygen- No [facility]worth- 16   Allergies  Allergen Reactions   Mepergan [Meperidine-Promethazine] Other (See  Comments)    hallucinations   Exenatide Rash    Immunization History  Administered Date(s) Administered   Influenza Split 11/10/2014   Influenza,inj,Quad PF,6+ Mos 10/15/2012    Past Medical History:  Diagnosis Date   Diabetes mellitus    Hyperlipidemia    Hypertension    Kidney stone    Sleep apnea    Small bowel obstruction (HCC)     Tobacco History: Social History   Tobacco Use  Smoking Status Former   Packs/day: 1.00   Years: 13.00   Total pack years: 13.00   Types: Cigarettes   Quit date: 06/23/2021   Years since quitting: 0.1   Passive exposure: Past  Smokeless Tobacco Never   Counseling given: Not Answered   Outpatient Medications Prior to Visit  Medication Sig Dispense Refill   aspirin EC 81 MG tablet Take 81 mg by mouth every morning. Swallow whole.     cetirizine (ZYRTEC) 10 MG tablet Take 10 mg by mouth every morning.     Dapagliflozin-metFORMIN HCl ER (XIGDUO XR) 10-998 MG TB24 Take 1 tablet by mouth 2 (two) times daily.     emtricitabine-tenofovir AF (DESCOVY) 200-25 MG tablet Take 1 tablet by mouth every morning.     insulin aspart (NOVOLOG FLEXPEN) 100 UNIT/ML FlexPen Inject 5 Units into the skin 3 (three) times daily with meals.     Insulin Glargine (BASAGLAR KWIKPEN) 100 UNIT/ML Inject 60 Units into the skin every morning.  lisinopril (PRINIVIL,ZESTRIL) 10 MG tablet Take 10 mg by mouth every morning.     nicotine (NICODERM CQ - DOSED IN MG/24 HOURS) 21 mg/24hr patch Place 21 mg onto the skin every morning.     polyethylene glycol powder (GLYCOLAX/MIRALAX) 17 GM/SCOOP powder Take 17 g by mouth 2 (two) times daily. Until daily soft stools  OTC (Patient taking differently: Take 17 g by mouth daily as needed (constipation).) 255 g 1   rosuvastatin (CRESTOR) 20 MG tablet Take 20 mg by mouth daily with supper.     Semaglutide, 1 MG/DOSE, (OZEMPIC, 1 MG/DOSE,) 4 MG/3ML SOPN Inject 1 mg into the skin every Wednesday.     sertraline (ZOLOFT) 100 MG  tablet Take 100 mg by mouth every morning.     tadalafil (CIALIS) 5 MG tablet Take 5 mg by mouth every morning.     No facility-administered medications prior to visit.    Review of Systems  Review of Systems  Constitutional: Negative.   HENT: Negative.    Respiratory: Negative.    Cardiovascular: Negative.   Psychiatric/Behavioral: Negative.      Physical Exam  BP 116/68 (BP Location: Left Arm, Patient Position: Sitting, Cuff Size: Normal)   Pulse 86   Temp 97.9 F (36.6 C) (Oral)   Ht '5\' 10"'$  (1.778 m)   Wt 190 lb 3.2 oz (86.3 kg)   SpO2 97%   BMI 27.29 kg/m  Physical Exam Constitutional:      Appearance: Normal appearance.  HENT:     Head: Normocephalic and atraumatic.  Cardiovascular:     Rate and Rhythm: Normal rate and regular rhythm.  Pulmonary:     Effort: Pulmonary effort is normal.     Breath sounds: Normal breath sounds.  Musculoskeletal:        General: Normal range of motion.  Skin:    General: Skin is warm and dry.  Neurological:     General: No focal deficit present.     Mental Status: He is alert and oriented to person, place, and time. Mental status is at baseline.  Psychiatric:        Mood and Affect: Mood normal.        Behavior: Behavior normal.        Thought Content: Thought content normal.        Judgment: Judgment normal.      Lab Results:  CBC    Component Value Date/Time   WBC 11.0 (H) 08/01/2021 0940   RBC 5.06 08/01/2021 0940   HGB 15.6 08/01/2021 0940   HCT 45.6 08/01/2021 0940   PLT 182 08/01/2021 0940   MCV 90.1 08/01/2021 0940   MCH 30.8 08/01/2021 0940   MCHC 34.2 08/01/2021 0940   RDW 12.3 08/01/2021 0940   LYMPHSABS 3.1 08/01/2021 0940   MONOABS 0.7 08/01/2021 0940   EOSABS 0.2 08/01/2021 0940   BASOSABS 0.0 08/01/2021 0940    BMET    Component Value Date/Time   NA 134 (L) 08/01/2021 0940   K 4.3 08/01/2021 0940   CL 102 08/01/2021 0940   CO2 21 (L) 08/01/2021 0940   GLUCOSE 202 (H) 08/01/2021 0940   BUN  21 (H) 08/01/2021 0940   CREATININE 0.90 08/01/2021 0940   CALCIUM 9.3 08/01/2021 0940   GFRNONAA >60 08/01/2021 0940   GFRAA >90 06/09/2012 0910    BNP No results found for: "BNP"  ProBNP No results found for: "PROBNP"  Imaging: No results found.   Assessment & Plan:   OSA (  obstructive sleep apnea) - NPSG 2008>> AHI 29/hr. Patient is compliant with CPAP and reports benefit from using. Having issues with gas/bloating. He has lost 20 lbs in last two years. Current pressure 5-20cm h20 (16cm h20-95%) with residual AHI 2.8/hr. Recommend lowering maximum CPAP pressure to 17cm h20. He is due for new CPAP machine but does not want to get one just yet. May want to consider repeating sleep study d.t weight loss if still having issues. DME company is [employer]. FU in 6 months or sooner if needed.      Martyn Ehrich, NP 08/08/2021

## 2021-08-08 NOTE — Patient Instructions (Addendum)
Recommend lowering maximum CPAP pressure setting to help with bloating, if change worsens your symptoms please let us know   You can get new CPAP machine every 5 years, let us know if you need an order for new machine at any point and we will be happy to do that for you  Congratulations on quitting smoking  Recommendations: Continue to wear CPAP every night for 4-6 hours or longer Maintain abstinence from cigarette smoke Referring you to lung cancer screening program  Orders: Change CPAP pressure 5-17cm h20    Follow-up: 6 months with Beth NP   CPAP and BIPAP Information CPAP and BIPAP are methods that use air pressure to keep your airways open and to help you breathe well. CPAP and BIPAP use different amounts of pressure. Your health care provider will tell you whether CPAP or BIPAP would be more helpful for you. CPAP stands for "continuous positive airway pressure." With CPAP, the amount of pressure stays the same while you breathe in (inhale) and out (exhale). BIPAP stands for "bi-level positive airway pressure." With BIPAP, the amount of pressure will be higher when you inhale and lower when you exhale. This allows you to take larger breaths. CPAP or BIPAP may be used in the hospital, or your health care provider may want you to use it at home. You may need to have a sleep study before your health care provider can order a machine for you to use at home. What are the advantages? CPAP or BIPAP can be helpful if you have: Sleep apnea. Chronic obstructive pulmonary disease (COPD). Heart failure. Medical conditions that cause muscle weakness, including muscular dystrophy or amyotrophic lateral sclerosis (ALS). Other problems that cause breathing to be shallow, weak, abnormal, or difficult. CPAP and BIPAP are most commonly used for obstructive sleep apnea (OSA) to keep the airways from collapsing when the muscles relax during sleep. What are the risks? Generally, this is a safe  treatment. However, problems may occur, including: Irritated skin or skin sores if the mask does not fit properly. Dry or stuffy nose or nosebleeds. Dry mouth. Feeling gassy or bloated. Sinus or lung infection if the equipment is not cleaned properly. When should CPAP or BIPAP be used? In most cases, the mask only needs to be worn during sleep. Generally, the mask needs to be worn throughout the night and during any daytime naps. People with certain medical conditions may also need to wear the mask at other times, such as when they are awake. Follow instructions from your health care provider about when to use the machine. What happens during CPAP or BIPAP?  Both CPAP and BIPAP are provided by a small machine with a flexible plastic tube that attaches to a plastic mask that you wear. Air is blown through the mask into your nose or mouth. The amount of pressure that is used to blow the air can be adjusted on the machine. Your health care provider will set the pressure setting and help you find the best mask for you. Tips for using the mask Because the mask needs to be snug, some people feel trapped or closed-in (claustrophobic) when first using the mask. If you feel this way, you may need to get used to the mask. One way to do this is to hold the mask loosely over your nose or mouth and then gradually apply the mask more snugly. You can also gradually increase the amount of time that you use the mask. Masks are available in various  types and sizes. If your mask does not fit well, talk with your health care provider about getting a different one. Some common types of masks include: Full face masks, which fit over the mouth and nose. Nasal masks, which fit over the nose. Nasal pillow or prong masks, which fit into the nostrils. If you are using a mask that fits over your nose and you tend to breathe through your mouth, a chin strap may be applied to help keep your mouth closed. Use a skin barrier to  protect your skin as told by your health care provider. Some CPAP and BIPAP machines have alarms that may sound if the mask comes off or develops a leak. If you have trouble with the mask, it is very important that you talk with your health care provider about finding a way to make the mask easier to tolerate. Do not stop using the mask. There could be a negative impact on your health if you stop using the mask. Tips for using the machine Place your CPAP or BIPAP machine on a secure table or stand near an electrical outlet. Know where the on/off switch is on the machine. Follow instructions from your health care provider about how to set the pressure on your machine and when you should use it. Do not eat or drink while the CPAP or BIPAP machine is on. Food or fluids could get pushed into your lungs by the pressure of the CPAP or BIPAP. For home use, CPAP and BIPAP machines can be rented or purchased through home health care companies. Many different brands of machines are available. Renting a machine before purchasing may help you find out which particular machine works well for you. Your health insurance company may also decide which machine you may get. Keep the CPAP or BIPAP machine and attachments clean. Ask your health care provider for specific instructions. Check the humidifier if you have a dry stuffy nose or nosebleeds. Make sure it is working correctly. Follow these instructions at home: Take over-the-counter and prescription medicines only as told by your health care provider. Ask if you can take sinus medicine if your sinuses are blocked. Do not use any products that contain nicotine or tobacco. These products include cigarettes, chewing tobacco, and vaping devices, such as e-cigarettes. If you need help quitting, ask your health care provider. Keep all follow-up visits. This is important. Contact a health care provider if: You have redness or pressure sores on your head, face, mouth, or  nose from the mask or head gear. You have trouble using the CPAP or BIPAP machine. You cannot tolerate wearing the CPAP or BIPAP mask. Someone tells you that you snore even when wearing your CPAP or BIPAP. Get help right away if: You have trouble breathing. You feel confused. Summary CPAP and BIPAP are methods that use air pressure to keep your airways open and to help you breathe well. If you have trouble with the mask, it is very important that you talk with your health care provider about finding a way to make the mask easier to tolerate. Do not stop using the mask. There could be a negative impact to your health if you stop using the mask. Follow instructions from your health care provider about when to use the machine. This information is not intended to replace advice given to you by your health care provider. Make sure you discuss any questions you have with your health care provider. Document Revised: 08/18/2020 Document Reviewed: 12/19/2019 Elsevier  Patient Education  [place].

## 2021-08-08 NOTE — Progress Notes (Signed)
Reviewed and agree with assessment/plan.   Safiyyah Vasconez, MD [facility] 08/08/2021, 4:22 PM Pager:  [phone]  

## 2021-08-08 NOTE — Assessment & Plan Note (Addendum)
-   NPSG 2008>> AHI 29/hr. Patient is compliant with CPAP and reports benefit from using. Having issues with gas/bloating. He has lost 20 lbs in last two years. Current pressure 5-20cm h20 (16cm h20-95%) with residual AHI 2.8/hr. Recommend lowering maximum CPAP pressure to 17cm h20. He is due for new CPAP machine but does not want to get one just yet. May want to consider repeating sleep study d.t weight loss if still having issues. DME company is [employer]. FU in 6 months or sooner if needed.

## 2022-01-06 ENCOUNTER — Telehealth: Payer: Self-pay | Admitting: Primary Care

## 2022-01-06 DIAGNOSIS — G4733 Obstructive sleep apnea (adult) (pediatric): Secondary | ICD-10-CM

## 2022-01-06 NOTE — Telephone Encounter (Signed)
Yes, fine with me. Please use setting from July.

## 2022-01-06 NOTE — Telephone Encounter (Signed)
He states he saw Geraldo Pitter NP initially on 08/08/2021 and his pressures were changed because he was bloated d/t the pressure.    Two weeks ago the motor went out and it stopped working.  [facility] ordered him a new machine, however they state they need a script to provide a new CPAP machine.  He states his current machine is very old (about 47 years old).    Beth, please advise if ok to order new machine for patient.  Thank you.

## 2022-01-10 NOTE — Telephone Encounter (Signed)
DME order placed with July settings to [employer].  Left detailed message on patient VM cpap order had been placed.  Call back number given for any questions or concerns.

## 2022-02-09 ENCOUNTER — Ambulatory Visit: Payer: 59 | Admitting: Primary Care

## 2022-02-20 ENCOUNTER — Ambulatory Visit: Payer: 59 | Admitting: Primary Care
